# Patient Record
Sex: Female | Born: 1960
Health system: Southern US, Community
[De-identification: ages and names within clinical notes are randomized; demographics above are authoritative.]

## PROBLEM LIST (undated history)

## (undated) DIAGNOSIS — Z8041 Family history of malignant neoplasm of ovary: Secondary | ICD-10-CM

## (undated) DIAGNOSIS — L659 Nonscarring hair loss, unspecified: Secondary | ICD-10-CM

## (undated) DIAGNOSIS — Z800 Family history of malignant neoplasm of digestive organs: Secondary | ICD-10-CM

## (undated) DIAGNOSIS — D649 Anemia, unspecified: Secondary | ICD-10-CM

## (undated) DIAGNOSIS — E559 Vitamin D deficiency, unspecified: Secondary | ICD-10-CM

## (undated) DIAGNOSIS — H269 Unspecified cataract: Secondary | ICD-10-CM

## (undated) DIAGNOSIS — J45909 Unspecified asthma, uncomplicated: Secondary | ICD-10-CM

## (undated) DIAGNOSIS — R011 Cardiac murmur, unspecified: Secondary | ICD-10-CM

## (undated) DIAGNOSIS — Z9109 Other allergy status, other than to drugs and biological substances: Secondary | ICD-10-CM

## (undated) DIAGNOSIS — T7840XA Allergy, unspecified, initial encounter: Secondary | ICD-10-CM

## (undated) DIAGNOSIS — I10 Essential (primary) hypertension: Secondary | ICD-10-CM

## (undated) DIAGNOSIS — C439 Malignant melanoma of skin, unspecified: Secondary | ICD-10-CM

## (undated) DIAGNOSIS — K635 Polyp of colon: Secondary | ICD-10-CM

## (undated) HISTORY — DX: Nonscarring hair loss, unspecified: L65.9

## (undated) HISTORY — PX: CATARACT EXTRACTION: SUR2

## (undated) HISTORY — DX: Polyp of colon: K63.5

## (undated) HISTORY — DX: Family history of malignant neoplasm of ovary: Z80.41

## (undated) HISTORY — DX: Unspecified asthma, uncomplicated: J45.909

## (undated) HISTORY — DX: Allergy, unspecified, initial encounter: T78.40XA

## (undated) HISTORY — DX: Essential (primary) hypertension: I10

## (undated) HISTORY — DX: Unspecified cataract: H26.9

## (undated) HISTORY — DX: Vitamin D deficiency, unspecified: E55.9

## (undated) HISTORY — DX: Family history of malignant neoplasm of digestive organs: Z80.0

## (undated) HISTORY — DX: Other allergy status, other than to drugs and biological substances: Z91.09

## (undated) HISTORY — DX: Malignant melanoma of skin, unspecified: C43.9

## (undated) HISTORY — DX: Cardiac murmur, unspecified: R01.1

## (undated) HISTORY — DX: Anemia, unspecified: D64.9

## (undated) HISTORY — PX: ARM SKIN LESION BIOPSY / EXCISION: SUR471

---

## 2011-09-29 HISTORY — PX: CYSTECTOMY: SUR359

## 2012-12-27 LAB — HM MAMMOGRAPHY

## 2013-09-22 ENCOUNTER — Telehealth: Payer: Self-pay

## 2013-09-22 NOTE — Telephone Encounter (Signed)
Medication and allergies:  Reviewed and updated  90 day supply/mail order: na Local pharmacy: [facility] [address]   Immunizations due:  Will bring records to visit/didn't want to give over phone    Leaving to go out of town  A/P:   Entered FH, PSH and personal hx Recently moved to the area Daughter is your [phone]  To Discuss with Provider: Not at this time

## 2013-09-26 ENCOUNTER — Ambulatory Visit (INDEPENDENT_AMBULATORY_CARE_PROVIDER_SITE_OTHER): Payer: BC Managed Care – PPO | Admitting: Family Medicine

## 2013-09-26 ENCOUNTER — Encounter: Payer: Self-pay | Admitting: Family Medicine

## 2013-09-26 VITALS — BP 126/80 | HR 93 | Temp 97.6°F | Ht 66.0 in | Wt 150.4 lb

## 2013-09-26 DIAGNOSIS — Z0000 Encounter for general adult medical examination without abnormal findings: Secondary | ICD-10-CM

## 2013-09-26 DIAGNOSIS — I10 Essential (primary) hypertension: Secondary | ICD-10-CM

## 2013-09-26 HISTORY — DX: Essential (primary) hypertension: I10

## 2013-09-26 NOTE — Progress Notes (Signed)
Subjective:     Savannah Deleon is a 52 y.o. female and is here for a comprehensive physical exam. The patient reports no problems.  History   Social History  . Marital Status: Single    Spouse Name: N/A    Number of Children: N/A  . Years of Education: N/A   Occupational History  . retired    Social History Main Topics  . Smoking status: Never Smoker   . Smokeless tobacco: Never Used  . Alcohol Use: Yes     Comment: Seldom  . Drug Use: No  . Sexual Activity: Yes    Partners: Male   Other Topics Concern  . Not on file   Social History Narrative   Exercise--limited,  Just moved from [place]               Health Maintenance  Topic Date Due  . Colonoscopy  01/03/2009  . Influenza Vaccine  09/26/2014  . Pap Smear  11/06/2014  . Mammogram  12/28/2014  . Tetanus/tdap  09/26/2021    The following portions of the patient's history were reviewed and updated as appropriate:  She  has a past medical history of Hypertension; Cataract; Asthma; Environmental allergies; and Allergy. She  does not have a problem list on file. She  has past surgical history that includes Cataract extraction (Left) and Cystectomy (2013). Her family history includes Colon cancer in her father; Heart disease in her sister; Heart failure in her maternal grandfather; Hypertension in her mother; Ovarian cancer in her mother. She  reports that she has never smoked. She has never used smokeless tobacco. She reports that she drinks alcohol. She reports that she does not use illicit drugs. She has a current medication list which includes the following prescription(s): lisinopril-hydrochlorothiazide. No current outpatient prescriptions on file prior to visit.   No current facility-administered medications on file prior to visit.   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  Review of Systems Review of Systems  Constitutional: Negative for activity change, appetite change and fatigue.  HENT: Negative for hearing loss,  congestion, tinnitus and ear discharge.  dentist q1m Eyes: Negative for visual disturbance (see optho q1y )  Respiratory: Negative for cough, chest tightness and shortness of breath.   Cardiovascular: Negative for chest pain, palpitations and leg swelling.  Gastrointestinal: Negative for abdominal pain, diarrhea, constipation and abdominal distention.  Genitourinary: Negative for urgency, frequency, decreased urine volume and difficulty urinating.  Musculoskeletal: Negative for back pain, arthralgias and gait problem.  Skin: Negative for color change, pallor and rash.  Neurological: Negative for dizziness, light-headedness, numbness and headaches.  Hematological: Negative for adenopathy. Does not bruise/bleed easily.  Psychiatric/Behavioral: Negative for suicidal ideas, confusion, sleep disturbance, self-injury, dysphoric mood, decreased concentration and agitation.       Objective:    BP 126/80  Pulse 93  Temp(Src) 97.6 F (36.4 C) (Oral)  Ht 5\' 6"  (1.676 m)  Wt 150 lb 6.4 oz (68.221 kg)  BMI 24.29 kg/m2  SpO2 97%  LMP 06/28/2013 General appearance: alert, cooperative, appears stated age and no distress Head: Normocephalic, without obvious abnormality, atraumatic Eyes: negative findings: lids and lashes normal and pupils equal, round, reactive to light and accomodation Ears: normal TM's and external ear canals both ears Nose: Nares normal. Septum midline. Mucosa normal. No drainage or sinus tenderness. Throat: lips, mucosa, and tongue normal; teeth and gums normal Neck: no adenopathy, no carotid bruit, no JVD, supple, symmetrical, trachea midline and thyroid not enlarged, symmetric, no tenderness/mass/nodules Back: symmetric,  no curvature. ROM normal. No CVA tenderness. Lungs: clear to auscultation bilaterally Breasts: normal appearance, no masses or tenderness Heart: regular rate and rhythm, S1, S2 normal, no murmur, click, rub or gallop Abdomen: soft, non-tender; bowel sounds  normal; no masses,  no organomegaly Pelvic: deferred Extremities: extremities normal, atraumatic, no cyanosis or edema Pulses: 2+ and symmetric Skin: Skin color, texture, turgor normal. No rashes or lesions Lymph nodes: Cervical, supraclavicular, and axillary nodes normal. Neurologic: Alert and oriented X 3, normal strength and tone. Normal symmetric reflexes. Normal coordination and gait Psych-- no depression, no anxiety      Assessment:    Healthy female exam.      Plan:  ghm utd Check labs   See After Visit Summary for Counseling Recommendations

## 2013-09-26 NOTE — Progress Notes (Signed)
Pre visit review using our clinic review tool, if applicable. No additional management support is needed unless otherwise documented below in the visit note. 

## 2013-09-26 NOTE — Patient Instructions (Signed)
Preventive Care for Adults, Female A healthy lifestyle and preventive care can promote health and wellness. Preventive health guidelines for women include the following key practices.  A routine yearly physical is a good way to check with your caregiver about your health and preventive screening. It is a chance to share any concerns and updates on your health, and to receive a thorough exam.  Visit your dentist for a routine exam and preventive care every 6 months. Brush your teeth twice a day and floss once a day. Good oral hygiene prevents tooth decay and gum disease.  The frequency of eye exams is based on your age, health, family medical history, use of contact lenses, and other factors. Follow your caregiver's recommendations for frequency of eye exams.  Eat a healthy diet. Foods like vegetables, fruits, whole grains, low-fat dairy products, and lean protein foods contain the nutrients you need without too many calories. Decrease your intake of foods high in solid fats, added sugars, and salt. Eat the right amount of calories for you.Get information about a proper diet from your caregiver, if necessary.  Regular physical exercise is one of the most important things you can do for your health. Most adults should get at least 150 minutes of moderate-intensity exercise (any activity that increases your heart rate and causes you to sweat) each week. In addition, most adults need muscle-strengthening exercises on 2 or more days a week.  Maintain a healthy weight. The body mass index (BMI) is a screening tool to identify possible weight problems. It provides an estimate of body fat based on height and weight. Your caregiver can help determine your BMI, and can help you achieve or maintain a healthy weight.For adults 20 years and older:  A BMI below 18.5 is considered underweight.  A BMI of 18.5 to 24.9 is normal.  A BMI of 25 to 29.9 is considered overweight.  A BMI of 30 and above is  considered obese.  Maintain normal blood lipids and cholesterol levels by exercising and minimizing your intake of saturated fat. Eat a balanced diet with plenty of fruit and vegetables. Blood tests for lipids and cholesterol should begin at age 20 and be repeated every 5 years. If your lipid or cholesterol levels are high, you are over 50, or you are at high risk for heart disease, you may need your cholesterol levels checked more frequently.Ongoing high lipid and cholesterol levels should be treated with medicines if diet and exercise are not effective.  If you smoke, find out from your caregiver how to quit. If you do not use tobacco, do not start.  Lung cancer screening is recommended for adults aged 55 80 years who are at high risk for developing lung cancer because of a history of smoking. Yearly low-dose computed tomography (CT) is recommended for people who have at least a 30-pack-year history of smoking and are a current smoker or have quit within the past 15 years. A pack year of smoking is smoking an average of 1 pack of cigarettes a day for 1 year (for example: 1 pack a day for 30 years or 2 packs a day for 15 years). Yearly screening should continue until the smoker has stopped smoking for at least 15 years. Yearly screening should also be stopped for people who develop a health problem that would prevent them from having lung cancer treatment.  If you are pregnant, do not drink alcohol. If you are breastfeeding, be very cautious about drinking alcohol. If you are   not pregnant and choose to drink alcohol, do not exceed 1 drink per day. One drink is considered to be 12 ounces (355 mL) of beer, 5 ounces (148 mL) of wine, or 1.5 ounces (44 mL) of liquor.  Avoid use of street drugs. Do not share needles with anyone. Ask for help if you need support or instructions about stopping the use of drugs.  High blood pressure causes heart disease and increases the risk of stroke. Your blood pressure  should be checked at least every 1 to 2 years. Ongoing high blood pressure should be treated with medicines if weight loss and exercise are not effective.  If you are 55 to 54 years old, ask your caregiver if you should take aspirin to prevent strokes.  Diabetes screening involves taking a blood sample to check your fasting blood sugar level. This should be done once every 3 years, after age 45, if you are within normal weight and without risk factors for diabetes. Testing should be considered at a younger age or be carried out more frequently if you are overweight and have at least 1 risk factor for diabetes.  Breast cancer screening is essential preventive care for women. You should practice "breast self-awareness." This means understanding the normal appearance and feel of your breasts and may include breast self-examination. Any changes detected, no matter how small, should be reported to a caregiver. Women in their 20s and 30s should have a clinical breast exam (CBE) by a caregiver as part of a regular health exam every 1 to 3 years. After age 40, women should have a CBE every year. Starting at age 40, women should consider having a mammography (breast X-ray test) every year. Women who have a family history of breast cancer should talk to their caregiver about genetic screening. Women at a high risk of breast cancer should talk to their caregivers about having magnetic resonance imaging (MRI) and a mammography every year.  Breast cancer gene (BRCA)-related cancer risk assessment is recommended for women who have family members with BRCA-related cancers. BRCA-related cancers include breast, ovarian, tubal, and peritoneal cancers. Having family members with these cancers may be associated with an increased risk for harmful changes (mutations) in the breast cancer genes BRCA1 and BRCA2. Results of the assessment will determine the need for genetic counseling and BRCA1 and BRCA2 testing.  The Pap test is  a screening test for cervical cancer. A Pap test can show cell changes on the cervix that might become cervical cancer if left untreated. A Pap test is a procedure in which cells are obtained and examined from the lower end of the uterus (cervix).  Women should have a Pap test starting at age 21.  Between ages 21 and 29, Pap tests should be repeated every 2 years.  Beginning at age 30, you should have a Pap test every 3 years as long as the past 3 Pap tests have been normal.  Some women have medical problems that increase the chance of getting cervical cancer. Talk to your caregiver about these problems. It is especially important to talk to your caregiver if a new problem develops soon after your last Pap test. In these cases, your caregiver may recommend more frequent screening and Pap tests.  The above recommendations are the same for women who have or have not gotten the vaccine for human papillomavirus (HPV).  If you had a hysterectomy for a problem that was not cancer or a condition that could lead to cancer, then   you no longer need Pap tests. Even if you no longer need a Pap test, a regular exam is a good idea to make sure no other problems are starting.  If you are between ages 65 and 70, and you have had normal Pap tests going back 10 years, you no longer need Pap tests. Even if you no longer need a Pap test, a regular exam is a good idea to make sure no other problems are starting.  If you have had past treatment for cervical cancer or a condition that could lead to cancer, you need Pap tests and screening for cancer for at least 20 years after your treatment.  If Pap tests have been discontinued, risk factors (such as a new sexual partner) need to be reassessed to determine if screening should be resumed.  The HPV test is an additional test that may be used for cervical cancer screening. The HPV test looks for the virus that can cause the cell changes on the cervix. The cells collected  during the Pap test can be tested for HPV. The HPV test could be used to screen women aged 30 years and older, and should be used in women of any age who have unclear Pap test results. After the age of 30, women should have HPV testing at the same frequency as a Pap test.  Colorectal cancer can be detected and often prevented. Most routine colorectal cancer screening begins at the age of 50 and continues through age 75. However, your caregiver may recommend screening at an earlier age if you have risk factors for colon cancer. On a yearly basis, your caregiver may provide home test kits to check for hidden blood in the stool. Use of a small camera at the end of a tube, to directly examine the colon (sigmoidoscopy or colonoscopy), can detect the earliest forms of colorectal cancer. Talk to your caregiver about this at age 50, when routine screening begins. Direct examination of the colon should be repeated every 5 to 10 years through age 75, unless early forms of pre-cancerous polyps or small growths are found.  Hepatitis C blood testing is recommended for all people born from 1945 through 1965 and any individual with known risks for hepatitis C.  Practice safe sex. Use condoms and avoid high-risk sexual practices to reduce the spread of sexually transmitted infections (STIs). STIs include gonorrhea, chlamydia, syphilis, trichomonas, herpes, HPV, and human immunodeficiency virus (HIV). Herpes, HIV, and HPV are viral illnesses that have no cure. They can result in disability, cancer, and death. Sexually active women aged 25 and younger should be checked for chlamydia. Older women with new or multiple partners should also be tested for chlamydia. Testing for other STIs is recommended if you are sexually active and at increased risk.  Osteoporosis is a disease in which the bones lose minerals and strength with aging. This can result in serious bone fractures. The risk of osteoporosis can be identified using a  bone density scan. Women ages 65 and over and women at risk for fractures or osteoporosis should discuss screening with their caregivers. Ask your caregiver whether you should take a calcium supplement or vitamin D to reduce the rate of osteoporosis.  Menopause can be associated with physical symptoms and risks. Hormone replacement therapy is available to decrease symptoms and risks. You should talk to your caregiver about whether hormone replacement therapy is right for you.  Use sunscreen. Apply sunscreen liberally and repeatedly throughout the day. You should seek shade   when your shadow is shorter than you. Protect yourself by wearing long sleeves, pants, a wide-brimmed hat, and sunglasses year round, whenever you are outdoors.  Once a month, do a whole body skin exam, using a mirror to look at the skin on your back. Notify your caregiver of new moles, moles that have irregular borders, moles that are larger than a pencil eraser, or moles that have changed in shape or color.  Stay current with required immunizations.  Influenza vaccine. All adults should be immunized every year.  Tetanus, diphtheria, and acellular pertussis (Td, Tdap) vaccine. Pregnant women should receive 1 dose of Tdap vaccine during each pregnancy. The dose should be obtained regardless of the length of time since the last dose. Immunization is preferred during the 27th to 36th week of gestation. An adult who has not previously received Tdap or who does not know her vaccine status should receive 1 dose of Tdap. This initial dose should be followed by tetanus and diphtheria toxoids (Td) booster doses every 10 years. Adults with an unknown or incomplete history of completing a 3-dose immunization series with Td-containing vaccines should begin or complete a primary immunization series including a Tdap dose. Adults should receive a Td booster every 10 years.  Varicella vaccine. An adult without evidence of immunity to varicella  should receive 2 doses or a second dose if she has previously received 1 dose. Pregnant females who do not have evidence of immunity should receive the first dose after pregnancy. This first dose should be obtained before leaving the health care facility. The second dose should be obtained 4 8 weeks after the first dose.  Human papillomavirus (HPV) vaccine. Females aged 13 26 years who have not received the vaccine previously should obtain the 3-dose series. The vaccine is not recommended for use in pregnant females. However, pregnancy testing is not needed before receiving a dose. If a female is found to be pregnant after receiving a dose, no treatment is needed. In that case, the remaining doses should be delayed until after the pregnancy. Immunization is recommended for any person with an immunocompromised condition through the age of 26 years if she did not get any or all doses earlier. During the 3-dose series, the second dose should be obtained 4 8 weeks after the first dose. The third dose should be obtained 24 weeks after the first dose and 16 weeks after the second dose.  Zoster vaccine. One dose is recommended for adults aged 60 years or older unless certain conditions are present.  Measles, mumps, and rubella (MMR) vaccine. Adults born before 1957 generally are considered immune to measles and mumps. Adults born in 1957 or later should have 1 or more doses of MMR vaccine unless there is a contraindication to the vaccine or there is laboratory evidence of immunity to each of the three diseases. A routine second dose of MMR vaccine should be obtained at least 28 days after the first dose for students attending postsecondary schools, health care workers, or international travelers. People who received inactivated measles vaccine or an unknown type of measles vaccine during 1963 1967 should receive 2 doses of MMR vaccine. People who received inactivated mumps vaccine or an unknown type of mumps vaccine  before 1979 and are at high risk for mumps infection should consider immunization with 2 doses of MMR vaccine. For females of childbearing age, rubella immunity should be determined. If there is no evidence of immunity, females who are not pregnant should be vaccinated. If there   is no evidence of immunity, females who are pregnant should delay immunization until after pregnancy. Unvaccinated health care workers born before 1957 who lack laboratory evidence of measles, mumps, or rubella immunity or laboratory confirmation of disease should consider measles and mumps immunization with 2 doses of MMR vaccine or rubella immunization with 1 dose of MMR vaccine.  Pneumococcal 13-valent conjugate (PCV13) vaccine. When indicated, a person who is uncertain of her immunization history and has no record of immunization should receive the PCV13 vaccine. An adult aged 19 years or older who has certain medical conditions and has not been previously immunized should receive 1 dose of PCV13 vaccine. This PCV13 should be followed with a dose of pneumococcal polysaccharide (PPSV23) vaccine. The PPSV23 vaccine dose should be obtained at least 8 weeks after the dose of PCV13 vaccine. An adult aged 19 years or older who has certain medical conditions and previously received 1 or more doses of PPSV23 vaccine should receive 1 dose of PCV13. The PCV13 vaccine dose should be obtained 1 or more years after the last PPSV23 vaccine dose.  Pneumococcal polysaccharide (PPSV23) vaccine. When PCV13 is also indicated, PCV13 should be obtained first. All adults aged 65 years and older should be immunized. An adult younger than age 65 years who has certain medical conditions should be immunized. Any person who resides in a nursing home or long-term care facility should be immunized. An adult smoker should be immunized. People with an immunocompromised condition and certain other conditions should receive both PCV13 and PPSV23 vaccines. People  with human immunodeficiency virus (HIV) infection should be immunized as soon as possible after diagnosis. Immunization during chemotherapy or radiation therapy should be avoided. Routine use of PPSV23 vaccine is not recommended for American Indians, [place] Natives, or people younger than 65 years unless there are medical conditions that require PPSV23 vaccine. When indicated, people who have unknown immunization and have no record of immunization should receive PPSV23 vaccine. One-time revaccination 5 years after the first dose of PPSV23 is recommended for people aged 19 64 years who have chronic kidney failure, nephrotic syndrome, asplenia, or immunocompromised conditions. People who received 1 2 doses of PPSV23 before age 65 years should receive another dose of PPSV23 vaccine at age 65 years or later if at least 5 years have passed since the previous dose. Doses of PPSV23 are not needed for people immunized with PPSV23 at or after age 65 years.  Meningococcal vaccine. Adults with asplenia or persistent complement component deficiencies should receive 2 doses of quadrivalent meningococcal conjugate (MenACWY-D) vaccine. The doses should be obtained at least 2 months apart. Microbiologists working with certain meningococcal bacteria, military recruits, people at risk during an outbreak, and people who travel to or live in countries with a high rate of meningitis should be immunized. A first-year college student up through age 21 years who is living in a residence hall should receive a dose if she did not receive a dose on or after her 16th birthday. Adults who have certain high-risk conditions should receive one or more doses of vaccine.  Hepatitis A vaccine. Adults who wish to be protected from this disease, have certain high-risk conditions, work with hepatitis A-infected animals, work in hepatitis A research labs, or travel to or work in countries with a high rate of hepatitis A should be immunized. Adults  who were previously unvaccinated and who anticipate close contact with an international adoptee during the first 60 days after arrival in the [place] from a country   with a high rate of hepatitis A should be immunized.  Hepatitis B vaccine. Adults who wish to be protected from this disease, have certain high-risk conditions, may be exposed to blood or other infectious body fluids, are household contacts or sex partners of hepatitis B positive people, are clients or workers in certain care facilities, or travel to or work in countries with a high rate of hepatitis B should be immunized.  Haemophilus influenzae type b (Hib) vaccine. A previously unvaccinated person with asplenia or sickle cell disease or having a scheduled splenectomy should receive 1 dose of Hib vaccine. Regardless of previous immunization, a recipient of a hematopoietic stem cell transplant should receive a 3-dose series 6 12 months after her successful transplant. Hib vaccine is not recommended for adults with HIV infection. Preventive Services / Frequency Ages 19 to 39  Blood pressure check.** / Every 1 to 2 years.  Lipid and cholesterol check.** / Every 5 years beginning at age 20.  Clinical breast exam.** / Every 3 years for women in their 20s and 30s.  BRCA-related cancer risk assessment.** / For women who have family members with a BRCA-related cancer (breast, ovarian, tubal, or peritoneal cancers).  Pap test.** / Every 2 years from ages 21 through 29. Every 3 years starting at age 30 through age 65 or 70 with a history of 3 consecutive normal Pap tests.  HPV screening.** / Every 3 years from ages 30 through ages 65 to 70 with a history of 3 consecutive normal Pap tests.  Hepatitis C blood test.** / For any individual with known risks for hepatitis C.  Skin self-exam. / Monthly.  Influenza vaccine. / Every year.  Tetanus, diphtheria, and acellular pertussis (Tdap, Td) vaccine.** / Consult your caregiver. Pregnant  women should receive 1 dose of Tdap vaccine during each pregnancy. 1 dose of Td every 10 years.  Varicella vaccine.** / Consult your caregiver. Pregnant females who do not have evidence of immunity should receive the first dose after pregnancy.  HPV vaccine. / 3 doses over 6 months, if 26 and younger. The vaccine is not recommended for use in pregnant females. However, pregnancy testing is not needed before receiving a dose.  Measles, mumps, rubella (MMR) vaccine.** / You need at least 1 dose of MMR if you were born in 1957 or later. You may also need a 2nd dose. For females of childbearing age, rubella immunity should be determined. If there is no evidence of immunity, females who are not pregnant should be vaccinated. If there is no evidence of immunity, females who are pregnant should delay immunization until after pregnancy.  Pneumococcal 13-valent conjugate (PCV13) vaccine.** / Consult your caregiver.  Pneumococcal polysaccharide (PPSV23) vaccine.** / 1 to 2 doses if you smoke cigarettes or if you have certain conditions.  Meningococcal vaccine.** / 1 dose if you are age 19 to 21 years and a first-year college student living in a residence hall, or have one of several medical conditions, you need to get vaccinated against meningococcal disease. You may also need additional booster doses.  Hepatitis A vaccine.** / Consult your caregiver.  Hepatitis B vaccine.** / Consult your caregiver.  Haemophilus influenzae type b (Hib) vaccine.** / Consult your caregiver. Ages 40 to 64  Blood pressure check.** / Every 1 to 2 years.  Lipid and cholesterol check.** / Every 5 years beginning at age 20.  Lung cancer screening. / Every year if you are aged 55 80 years and have a 30-pack-year history of smoking and   currently smoke or have quit within the past 15 years. Yearly screening is stopped once you have quit smoking for at least 15 years or develop a health problem that would prevent you from having  lung cancer treatment.  Clinical breast exam.** / Every year after age 40.  BRCA-related cancer risk assessment.** / For women who have family members with a BRCA-related cancer (breast, ovarian, tubal, or peritoneal cancers).  Mammogram.** / Every year beginning at age 40 and continuing for as long as you are in good health. Consult with your caregiver.  Pap test.** / Every 3 years starting at age 30 through age 65 or 70 with a history of 3 consecutive normal Pap tests.  HPV screening.** / Every 3 years from ages 30 through ages 65 to 70 with a history of 3 consecutive normal Pap tests.  Fecal occult blood test (FOBT) of stool. / Every year beginning at age 50 and continuing until age 75. You may not need to do this test if you get a colonoscopy every 10 years.  Flexible sigmoidoscopy or colonoscopy.** / Every 5 years for a flexible sigmoidoscopy or every 10 years for a colonoscopy beginning at age 50 and continuing until age 75.  Hepatitis C blood test.** / For all people born from 1945 through 1965 and any individual with known risks for hepatitis C.  Skin self-exam. / Monthly.  Influenza vaccine. / Every year.  Tetanus, diphtheria, and acellular pertussis (Tdap/Td) vaccine.** / Consult your caregiver. Pregnant women should receive 1 dose of Tdap vaccine during each pregnancy. 1 dose of Td every 10 years.  Varicella vaccine.** / Consult your caregiver. Pregnant females who do not have evidence of immunity should receive the first dose after pregnancy.  Zoster vaccine.** / 1 dose for adults aged 60 years or older.  Measles, mumps, rubella (MMR) vaccine.** / You need at least 1 dose of MMR if you were born in 1957 or later. You may also need a 2nd dose. For females of childbearing age, rubella immunity should be determined. If there is no evidence of immunity, females who are not pregnant should be vaccinated. If there is no evidence of immunity, females who are pregnant should delay  immunization until after pregnancy.  Pneumococcal 13-valent conjugate (PCV13) vaccine.** / Consult your caregiver.  Pneumococcal polysaccharide (PPSV23) vaccine.** / 1 to 2 doses if you smoke cigarettes or if you have certain conditions.  Meningococcal vaccine.** / Consult your caregiver.  Hepatitis A vaccine.** / Consult your caregiver.  Hepatitis B vaccine.** / Consult your caregiver.  Haemophilus influenzae type b (Hib) vaccine.** / Consult your caregiver. Ages 65 and over  Blood pressure check.** / Every 1 to 2 years.  Lipid and cholesterol check.** / Every 5 years beginning at age 20.  Lung cancer screening. / Every year if you are aged 55 80 years and have a 30-pack-year history of smoking and currently smoke or have quit within the past 15 years. Yearly screening is stopped once you have quit smoking for at least 15 years or develop a health problem that would prevent you from having lung cancer treatment.  Clinical breast exam.** / Every year after age 40.  BRCA-related cancer risk assessment.** / For women who have family members with a BRCA-related cancer (breast, ovarian, tubal, or peritoneal cancers).  Mammogram.** / Every year beginning at age 40 and continuing for as long as you are in good health. Consult with your caregiver.  Pap test.** / Every 3 years starting at age   30 through age 65 or 70 with a 3 consecutive normal Pap tests. Testing can be stopped between 65 and 70 with 3 consecutive normal Pap tests and no abnormal Pap or HPV tests in the past 10 years.  HPV screening.** / Every 3 years from ages 30 through ages 65 or 70 with a history of 3 consecutive normal Pap tests. Testing can be stopped between 65 and 70 with 3 consecutive normal Pap tests and no abnormal Pap or HPV tests in the past 10 years.  Fecal occult blood test (FOBT) of stool. / Every year beginning at age 50 and continuing until age 75. You may not need to do this test if you get a colonoscopy  every 10 years.  Flexible sigmoidoscopy or colonoscopy.** / Every 5 years for a flexible sigmoidoscopy or every 10 years for a colonoscopy beginning at age 50 and continuing until age 75.  Hepatitis C blood test.** / For all people born from 1945 through 1965 and any individual with known risks for hepatitis C.  Osteoporosis screening.** / A one-time screening for women ages 65 and over and women at risk for fractures or osteoporosis.  Skin self-exam. / Monthly.  Influenza vaccine. / Every year.  Tetanus, diphtheria, and acellular pertussis (Tdap/Td) vaccine.** / 1 dose of Td every 10 years.  Varicella vaccine.** / Consult your caregiver.  Zoster vaccine.** / 1 dose for adults aged 60 years or older.  Pneumococcal 13-valent conjugate (PCV13) vaccine.** / Consult your caregiver.  Pneumococcal polysaccharide (PPSV23) vaccine.** / 1 dose for all adults aged 65 years and older.  Meningococcal vaccine.** / Consult your caregiver.  Hepatitis A vaccine.** / Consult your caregiver.  Hepatitis B vaccine.** / Consult your caregiver.  Haemophilus influenzae type b (Hib) vaccine.** / Consult your caregiver. ** Family history and personal history of risk and conditions may change your caregiver's recommendations. Document Released: 11/10/2001 Document Revised: 01/09/2013 Document Reviewed: 02/09/2011 ExitCare Patient Information 2014 [employer].  

## 2013-09-26 NOTE — Assessment & Plan Note (Signed)
con't med stable

## 2013-10-04 ENCOUNTER — Other Ambulatory Visit: Payer: Self-pay | Admitting: Family Medicine

## 2013-10-04 ENCOUNTER — Telehealth: Payer: Self-pay

## 2013-10-04 DIAGNOSIS — M79643 Pain in unspecified hand: Secondary | ICD-10-CM

## 2013-10-04 NOTE — Telephone Encounter (Signed)
Spoke with patient who was was checking the status of her Ortho referral. She said she discussed with Dr.Lowne at her OV the nodules on her hands (Knuckles and middle finger) and a referral for Ortho was going to be put in. No documentation. Please advise      KP

## 2013-10-04 NOTE — Telephone Encounter (Signed)
Referral in

## 2013-11-03 ENCOUNTER — Encounter: Payer: Self-pay | Admitting: Family Medicine

## 2013-11-15 ENCOUNTER — Telehealth: Payer: Self-pay

## 2013-11-15 MED ORDER — LISINOPRIL-HYDROCHLOROTHIAZIDE 10-12.5 MG PO TABS: 1.0000 | ORAL_TABLET | Freq: Every day | ORAL | Status: DC

## 2013-11-15 NOTE — Telephone Encounter (Signed)
The patient called and is hoping to get a refill of her lisinopril sent to [facility] on [address] (pharmacy phone [phone])   The pt states while she was at her apt, she was told the medication was called into this pharmacy.   Pt callback - [phone] / cell [phone]

## 2013-11-15 NOTE — Telephone Encounter (Signed)
Med filled.  

## 2014-03-06 ENCOUNTER — Other Ambulatory Visit: Payer: Self-pay | Admitting: Family Medicine

## 2014-03-06 DIAGNOSIS — Z1231 Encounter for screening mammogram for malignant neoplasm of breast: Secondary | ICD-10-CM

## 2014-03-08 ENCOUNTER — Encounter: Payer: Self-pay | Admitting: Internal Medicine

## 2014-03-21 ENCOUNTER — Ambulatory Visit: Payer: BC Managed Care – PPO | Admitting: Family Medicine

## 2014-04-25 ENCOUNTER — Ambulatory Visit (AMBULATORY_SURGERY_CENTER): Payer: Federal, State, Local not specified - PPO | Admitting: *Deleted

## 2014-04-25 VITALS — Ht 66.0 in | Wt 158.4 lb

## 2014-04-25 DIAGNOSIS — Z800 Family history of malignant neoplasm of digestive organs: Secondary | ICD-10-CM

## 2014-04-25 DIAGNOSIS — Z1211 Encounter for screening for malignant neoplasm of colon: Secondary | ICD-10-CM

## 2014-04-25 MED ORDER — MOVIPREP 100 G PO SOLR: 1.0000 | Freq: Once | ORAL | Status: DC

## 2014-04-25 NOTE — Progress Notes (Signed)
Denies allergies to eggs or soy products. Denies complications with sedation or anesthesia. Denies O2 use. Denies use of diet or weight loss medications.  Emmi instructions given for colonoscopy.  

## 2014-04-26 ENCOUNTER — Encounter (INDEPENDENT_AMBULATORY_CARE_PROVIDER_SITE_OTHER): Payer: Self-pay

## 2014-04-26 ENCOUNTER — Ambulatory Visit
Admission: RE | Admit: 2014-04-26 | Discharge: 2014-04-26 | Disposition: A | Discharge status: Home / Self Care | Payer: Federal, State, Local not specified - PPO | Source: Ambulatory Visit | Attending: Family Medicine | Admitting: Family Medicine

## 2014-04-26 DIAGNOSIS — Z1231 Encounter for screening mammogram for malignant neoplasm of breast: Secondary | ICD-10-CM

## 2014-05-07 ENCOUNTER — Telehealth: Payer: Self-pay | Admitting: Internal Medicine

## 2014-05-08 ENCOUNTER — Ambulatory Visit: Payer: BC Managed Care – PPO | Admitting: Family Medicine

## 2014-05-08 NOTE — Telephone Encounter (Signed)
No charge. 

## 2014-05-09 ENCOUNTER — Encounter: Payer: BC Managed Care – PPO | Admitting: Internal Medicine

## 2014-05-14 ENCOUNTER — Telehealth: Payer: Self-pay

## 2014-05-14 ENCOUNTER — Ambulatory Visit (INDEPENDENT_AMBULATORY_CARE_PROVIDER_SITE_OTHER): Payer: Federal, State, Local not specified - PPO | Admitting: Family Medicine

## 2014-05-14 ENCOUNTER — Encounter: Payer: Self-pay | Admitting: Family Medicine

## 2014-05-14 VITALS — BP 124/72 | HR 74 | Temp 97.7°F | Ht 66.0 in | Wt 158.0 lb

## 2014-05-14 DIAGNOSIS — Z0000 Encounter for general adult medical examination without abnormal findings: Secondary | ICD-10-CM

## 2014-05-14 DIAGNOSIS — N816 Rectocele: Secondary | ICD-10-CM

## 2014-05-14 DIAGNOSIS — Z01419 Encounter for gynecological examination (general) (routine) without abnormal findings: Secondary | ICD-10-CM

## 2014-05-14 LAB — LIPID PANEL
Cholesterol: 261 mg/dL — ABNORMAL HIGH (ref 0–200)
HDL: 75.4 mg/dL (ref >39.00)
LDL Cholesterol: 162 mg/dL — ABNORMAL HIGH (ref 0–99)
NONHDL: 185.6
TRIGLYCERIDES: 119 mg/dL (ref 0.0–149.0)
Total CHOL/HDL Ratio: 3
VLDL: 23.8 mg/dL (ref 0.0–40.0)

## 2014-05-14 LAB — POCT URINALYSIS DIPSTICK
Bilirubin, UA: NEGATIVE
GLUCOSE UA: NEGATIVE
Ketones, UA: NEGATIVE
Leukocytes, UA: NEGATIVE
Nitrite, UA: NEGATIVE
PH UA: 7
Protein, UA: NEGATIVE
RBC UA: NEGATIVE
UROBILINOGEN UA: 0.2

## 2014-05-14 LAB — BASIC METABOLIC PANEL
BUN: 13 mg/dL (ref 6–23)
CO2: 28 meq/L (ref 19–32)
CREATININE: 0.8 mg/dL (ref 0.4–1.2)
Calcium: 9.4 mg/dL (ref 8.4–10.5)
Chloride: 97 mEq/L (ref 96–112)
GFR: 91.67 mL/min (ref >60.00)
Glucose, Bld: 64 mg/dL — ABNORMAL LOW (ref 70–99)
Potassium: 3.5 mEq/L (ref 3.5–5.1)
Sodium: 135 mEq/L (ref 135–145)

## 2014-05-14 LAB — HEPATIC FUNCTION PANEL
ALK PHOS: 52 U/L (ref 39–117)
ALT: 19 U/L (ref 0–35)
AST: 23 U/L (ref 0–37)
Albumin: 4 g/dL (ref 3.5–5.2)
BILIRUBIN DIRECT: 0 mg/dL (ref 0.0–0.3)
TOTAL PROTEIN: 7.8 g/dL (ref 6.0–8.3)
Total Bilirubin: 0.7 mg/dL (ref 0.2–1.2)

## 2014-05-14 LAB — CBC WITH DIFFERENTIAL/PLATELET
BASOS ABS: 0 10*3/uL (ref 0.0–0.1)
BASOS PCT: 0.3 % (ref 0.0–3.0)
EOS ABS: 0.1 10*3/uL (ref 0.0–0.7)
Eosinophils Relative: 1 % (ref 0.0–5.0)
HCT: 41.1 % (ref 36.0–46.0)
Hemoglobin: 13.4 g/dL (ref 12.0–15.0)
Lymphocytes Relative: 32.3 % (ref 12.0–46.0)
Lymphs Abs: 2 10*3/uL (ref 0.7–4.0)
MCHC: 32.6 g/dL (ref 30.0–36.0)
MCV: 84.8 fl (ref 78.0–100.0)
MONO ABS: 0.8 10*3/uL (ref 0.1–1.0)
Monocytes Relative: 13.1 % — ABNORMAL HIGH (ref 3.0–12.0)
NEUTROS PCT: 53.3 % (ref 43.0–77.0)
Neutro Abs: 3.3 10*3/uL (ref 1.4–7.7)
Platelets: 312 10*3/uL (ref 150.0–400.0)
RBC: 4.85 Mil/uL (ref 3.87–5.11)
RDW: 15 % (ref 11.5–15.5)
WBC: 6.3 10*3/uL (ref 4.0–10.5)

## 2014-05-14 LAB — TSH: TSH: 0.64 u[IU]/mL (ref 0.35–4.50)

## 2014-05-14 MED ORDER — LISINOPRIL-HYDROCHLOROTHIAZIDE 10-12.5 MG PO TABS: 1.0000 | ORAL_TABLET | Freq: Every day | ORAL | Status: DC

## 2014-05-14 NOTE — Patient Instructions (Signed)
Pap Test A Pap test is a procedure done in a clinic office to evaluate cells that are on the surface of the cervix. The cervix is the lower portion of the uterus and upper portion of the vagina. For some women, the cervical region has the potential to form cancer. With consistent evaluations by your caregiver, this type of cancer can be prevented.  If a Pap test is abnormal, it is most often a result of a previous exposure to human papillomavirus (HPV). HPV is a virus that can infect the cells of the cervix and cause dysplasia. Dysplasia is where the cells no longer look normal. If a woman has been diagnosed with high-grade or severe dysplasia, they are at higher risk of developing cervical cancer. People diagnosed with low-grade dysplasia should still be seen by their caregiver because there is a small chance that low-grade dysplasia could develop into cancer.  LET YOUR CAREGIVER KNOW ABOUT:  Recent sexually transmitted infection (STI) you have had.  Any new sex partners you have had.  History of previous abnormal Pap tests results.  History of previous cervical procedures you have had (colposcopy, biopsy, loop electrosurgical excision procedure [LEEP]).  Concerns you have had regarding unusual vaginal discharge.  History of pelvic pain.  Your use of birth control. BEFORE THE PROCEDURE  Ask your caregiver when to schedule your Pap test. It is best not to be on your period if your caregiver uses a wooden spatula to collect cells or applies cells to a glass slide. Newer techniques are not so sensitive to the timing of a menstrual cycle.  Do not douche or have sexual intercourse for 24 hours before the test.   Do not use vaginal creams or tampons for 24 hours before the test.   Empty your bladder just before the test to lessen any discomfort.  PROCEDURE You will lie on an exam table with your feet in stirrups. A warm metal or plastic instrument (speculum) is placed in your vagina. This  instrument allows your caregiver to see the inside of your vagina and look at your cervix. A small, plastic brush or wooden spatula is then used to collect cervical cells. These cells are placed in a lab specimen container. The cells are looked at under a microscope. A specialist will determine if the cells are normal.  AFTER THE PROCEDURE Make sure to get your test results.If your results come back abnormal, you may need further testing.  Document Released: 12/05/2002 Document Revised: 12/07/2011 Document Reviewed: 09/10/2011 ExitCare Patient Information 2015 [employer]. This information is not intended to replace advice given to you by your health care provider. Make sure you discuss any questions you have with your health care provider.  

## 2014-05-14 NOTE — Progress Notes (Signed)
Pre visit review using our clinic review tool, if applicable. No additional management support is needed unless otherwise documented below in the visit note. 

## 2014-05-14 NOTE — Telephone Encounter (Signed)
Spoke with pt and she is aware.

## 2014-05-14 NOTE — Telephone Encounter (Signed)
Message copied by Algernon Huxley on Mon May 14, 2014  1:31 PM ------      Message from: Jerene Bears      Created: Mon May 14, 2014 12:16 PM       Should be okay for colonoscopy, but thanks for letting me know      Ether Griffins      Please let pt know she should be fine with colonoscopy given her rectocele.      Thanks      JMP            ----- Message -----         From: Rosalita Chessman, DO         Sent: 05/14/2014  10:45 AM           To: Jerene Bears, MD            Kaisa is scheduled for a colonoscopy with you on Wednesday.  She has a pretty significant rectocele.  When she was called for prescreening she said she mentioned it to them but whoever she spoke with did not know if it would be a problem.  I'm sending her to gyn but of course the patient wants to know if you will be able to do the colonoscopy before she does the prep.   Thanks   Kendrick Fries       ------

## 2014-05-14 NOTE — Progress Notes (Signed)
Subjective:     Savannah Deleon is a 53 y.o. female and is here for a comprehensive physical exam. The patient reports no problems.  History   Social History  . Marital Status: Single    Spouse Name: N/A    Number of Children: N/A  . Years of Education: N/A   Occupational History  . retired    Social History Main Topics  . Smoking status: Never Smoker   . Smokeless tobacco: Never Used  . Alcohol Use: Yes     Comment: Seldom  . Drug Use: No  . Sexual Activity: Yes    Partners: Male   Other Topics Concern  . Not on file   Social History Narrative   Exercise--limited,  Just moved from [place]               Health Maintenance  Topic Date Due  . Colonoscopy  01/03/2009  . Influenza Vaccine  07/14/2014 (Originally 04/28/2014)  . Pap Smear  11/06/2014  . Mammogram  04/26/2016  . Tetanus/tdap  09/26/2021    The following portions of the patient's history were reviewed and updated as appropriate:  She  has a past medical history of Hypertension; Cataract; Asthma; Environmental allergies; and Allergy. She  does not have any pertinent problems on file. She  has past surgical history that includes Cataract extraction (Left) and Cystectomy (2013). Her family history includes Colon cancer in her father; Heart disease in her sister; Heart failure in her maternal grandfather; Hypertension in her mother; Ovarian cancer in her mother. She  reports that she has never smoked. She has never used smokeless tobacco. She reports that she drinks alcohol. She reports that she does not use illicit drugs. She has a current medication list which includes the following prescription(s): lisinopril-hydrochlorothiazide, moviprep, multiple vitamins-minerals, fish oil, and probiotic product. Current Outpatient Prescriptions on File Prior to Visit  Medication Sig Dispense Refill  . MOVIPREP 100 G SOLR Take 1 kit (200 g total) by mouth once.  1 kit  0  . Multiple Vitamins-Minerals (MULTIVITAMIN PO) Take by  mouth.      . Omega-3 Fatty Acids (FISH OIL) 1200 MG CAPS Take by mouth.      . Probiotic Product (PROBIOTIC DAILY PO) Take by mouth.       No current facility-administered medications on file prior to visit.   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  Review of Systems Review of Systems  Constitutional: Negative for activity change, appetite change and fatigue.  HENT: Negative for hearing loss, congestion, tinnitus and ear discharge.  dentist q37mEyes: Negative for visual disturbance (see optho q1y -- vision corrected to 20/20 with glasses).  Respiratory: Negative for cough, chest tightness and shortness of breath.   Cardiovascular: Negative for chest pain, palpitations and leg swelling.  Gastrointestinal: Negative for abdominal pain, diarrhea, constipation and abdominal distention.  Genitourinary: Negative for urgency, frequency, decreased urine volume and difficulty urinating.  Musculoskeletal: Negative for back pain, arthralgias and gait problem.  Skin: Negative for color change, pallor and rash.  Neurological: Negative for dizziness, light-headedness, numbness and headaches.  Hematological: Negative for adenopathy. Does not bruise/bleed easily.  Psychiatric/Behavioral: Negative for suicidal ideas, confusion, sleep disturbance, self-injury, dysphoric mood, decreased concentration and agitation.       Objective:     BP 124/72  Pulse 74  Temp(Src) 97.7 F (36.5 C) (Oral)  Ht 5' 6"  (1.676 m)  Wt 158 lb (71.668 kg)  BMI 25.51 kg/m2  SpO2 98%  LMP 06/28/2013  General Appearance:    Alert, cooperative, no distress, appears stated age  Head:    Normocephalic, without obvious abnormality, atraumatic  Eyes:    PERRL, conjunctiva/corneas clear, EOM's intact, fundi    benign, both eyes  Ears:    Normal TM's and external ear canals, both ears  Nose:   Nares normal, septum midline, mucosa normal, no drainage    or sinus tenderness  Throat:   Lips, mucosa, and tongue normal; teeth and gums  normal  Neck:   Supple, symmetrical, trachea midline, no adenopathy;    thyroid:  no enlargement/tenderness/nodules; no carotid   bruit or JVD  Back:     Symmetric, no curvature, ROM normal, no CVA tenderness  Lungs:     Clear to auscultation bilaterally, respirations unlabored  Chest Wall:    No tenderness or deformity   Heart:    Regular rate and rhythm, S1 and S2 normal, no murmur, rub   or gallop  Breast Exam:    No tenderness, masses, or nipple abnormality  Abdomen:     Soft, non-tender, bowel sounds active all four quadrants,    no masses, no organomegaly  Genitalia:    Normal female without lesion, discharge or tenderness-- pap done  Rectal:    Normal tone,, no masses or tenderness;   guaiac negative stool  Extremities:   Extremities normal, atraumatic, no cyanosis or edema  Pulses:   2+ and symmetric all extremities  Skin:   Skin color, texture, turgor normal, no rashes or lesions  Lymph nodes:   Cervical, supraclavicular, and axillary nodes normal  Neurologic:   CNII-XII intact, normal strength, sensation and reflexes    throughout     Assessment:    Healthy female exam.      Plan:    ghm utd--- colonoscopy Wed--- d/w GI-- ok for colon with rectocele Check labs See After Visit Summary for Counseling Recommendations   1. Preventative health care   - Basic metabolic panel - CBC with Differential - Hepatic function panel - Lipid panel - POCT urinalysis dipstick - TSH  2. Rectocele   - Ambulatory referral to Gynecology  3. Encounter for routine gynecological examination

## 2014-05-16 ENCOUNTER — Ambulatory Visit (AMBULATORY_SURGERY_CENTER): Payer: Federal, State, Local not specified - PPO | Admitting: Internal Medicine

## 2014-05-16 ENCOUNTER — Encounter: Payer: Self-pay | Admitting: Internal Medicine

## 2014-05-16 VITALS — BP 113/70 | HR 79 | Temp 98.3°F | Resp 16 | Ht 66.0 in | Wt 155.0 lb

## 2014-05-16 DIAGNOSIS — Z1211 Encounter for screening for malignant neoplasm of colon: Secondary | ICD-10-CM

## 2014-05-16 DIAGNOSIS — D126 Benign neoplasm of colon, unspecified: Secondary | ICD-10-CM

## 2014-05-16 MED ORDER — SODIUM CHLORIDE 0.9 % IV SOLN: 500.0000 mL | INTRAVENOUS | Status: DC

## 2014-05-16 NOTE — Progress Notes (Signed)
Procedure ends, to recovery, report given and VSS. 

## 2014-05-16 NOTE — Progress Notes (Signed)
Called to room to assist during endoscopic procedure.  Patient ID and intended procedure confirmed with present staff. Received instructions for my participation in the procedure from the performing physician.  

## 2014-05-16 NOTE — Op Note (Signed)
[facility]  [address] [place], [address]   COLONOSCOPY PROCEDURE REPORT  PATIENT: Savannah Deleon, Limb  MR#: [id number] BIRTHDATE: Mar 08, 1961 , 47  yrs. old GENDER: Female ENDOSCOPIST: Jerene Bears, MD REFERRED MM:NOTRRN Latimer, DO PROCEDURE DATE:  05/16/2014 PROCEDURE:   Colonoscopy with snare polypectomy First Screening Colonoscopy - Avg.  risk and is 50 yrs.  old or older - No.  Prior Negative Screening - Now for repeat screening. N/A  History of Adenoma - Now for follow-up colonoscopy & has been > or = to 3 yrs.  N/A  Polyps Removed Today? Yes. ASA CLASS:   Class II INDICATIONS:elevated risk screening, Patient's immediate family history of colon cancer, and first colonoscopy. MEDICATIONS: MAC sedation, administered by CRNA and propofol (Diprivan) 260mg  IV  DESCRIPTION OF PROCEDURE:   After the risks benefits and alternatives of the procedure were thoroughly explained, informed consent was obtained.  A digital rectal exam revealed no rectal mass and vaginal prolapse.   The LB [id number]  endoscope was introduced through the anus and advanced to the cecum, which was identified by both the appendix and ileocecal valve. No adverse events experienced.   The quality of the prep was good, using MoviPrep  The instrument was then slowly withdrawn as the colon was fully examined.   COLON FINDINGS: A sessile polyp measuring 5 mm in size was found in the transverse colon.  A polypectomy was performed with a cold snare.  The resection was complete and the polyp tissue was completely retrieved.   The colon mucosa was otherwise normal. Retroflexed views revealed no abnormalities. The time to cecum=3 minutes 49 seconds.  Withdrawal time=9 minutes 05 seconds.  The scope was withdrawn and the procedure completed. COMPLICATIONS: There were no complications.  ENDOSCOPIC IMPRESSION: 1.   Sessile polyp measuring 5 mm in size was found in the transverse colon; polypectomy  was performed with a cold snare 2.   The colon mucosa was otherwise normal  RECOMMENDATIONS: 1.  Await pathology results 2.  Repeat Colonoscopy in 5 years. 3.  You will receive a letter within 1-2 weeks with the results of your biopsy as well as final recommendations.  Please call my office if you have not received a letter after 3 weeks.   eSigned:  Jerene Bears, MD 05/16/2014 2:34 PM   cc: The Patient and Rosalita Chessman, DO

## 2014-05-16 NOTE — Patient Instructions (Signed)
Colon polyp removed today, see handout given. Repeat colonoscopy in 5 years. Resume current medications.  Call us with any questions or concerns. Thank you!!  YOU HAD AN ENDOSCOPIC PROCEDURE TODAY AT [facility]: Refer to the procedure report that was given to you for any specific questions about what was found during the examination.  If the procedure report does not answer your questions, please call your gastroenterologist to clarify.  If you requested that your care partner not be given the details of your procedure findings, then the procedure report has been included in a sealed envelope for you to review at your convenience later.  YOU SHOULD EXPECT: Some feelings of bloating in the abdomen. Passage of more gas than usual.  Walking can help get rid of the air that was put into your GI tract during the procedure and reduce the bloating. If you had a lower endoscopy (such as a colonoscopy or flexible sigmoidoscopy) you may notice spotting of blood in your stool or on the toilet paper. If you underwent a bowel prep for your procedure, then you may not have a normal bowel movement for a few days.  DIET: Your first meal following the procedure should be a light meal and then it is ok to progress to your normal diet.  A half-sandwich or bowl of soup is an example of a good first meal.  Heavy or fried foods are harder to digest and may make you feel nauseous or bloated.  Likewise meals heavy in dairy and vegetables can cause extra gas to form and this can also increase the bloating.  Drink plenty of fluids but you should avoid alcoholic beverages for 24 hours.  ACTIVITY: Your care partner should take you home directly after the procedure.  You should plan to take it easy, moving slowly for the rest of the day.  You can resume normal activity the day after the procedure however you should NOT DRIVE or use heavy machinery for 24 hours (because of the sedation medicines used during the test).     SYMPTOMS TO REPORT IMMEDIATELY: A gastroenterologist can be reached at any hour.  During normal business hours, 8:30 AM to 5:00 PM Monday through Friday, call [phone].  After hours and on weekends, please call the GI answering service at [phone] who will take a message and have the physician on call contact you.   Following lower endoscopy (colonoscopy or flexible sigmoidoscopy):  Excessive amounts of blood in the stool  Significant tenderness or worsening of abdominal pains  Swelling of the abdomen that is new, acute  Fever of 100F or higher  Following upper endoscopy (EGD)  Vomiting of blood or coffee ground material  New chest pain or pain under the shoulder blades  Painful or persistently difficult swallowing  New shortness of breath  Fever of 100F or higher  Black, tarry-looking stools  FOLLOW UP: If any biopsies were taken you will be contacted by phone or by letter within the next 1-3 weeks.  Call your gastroenterologist if you have not heard about the biopsies in 3 weeks.  Our staff will call the home number listed on your records the next business day following your procedure to check on you and address any questions or concerns that you may have at that time regarding the information given to you following your procedure. This is a courtesy call and so if there is no answer at the home number and we have not heard from you  through the emergency physician on call, we will assume that you have returned to your regular daily activities without incident.  SIGNATURES/CONFIDENTIALITY: You and/or your care partner have signed paperwork which will be entered into your electronic medical record.  These signatures attest to the fact that that the information above on your After Visit Summary has been reviewed and is understood.  Full responsibility of the confidentiality of this discharge information lies with you and/or your care-partner.

## 2014-05-17 ENCOUNTER — Telehealth: Payer: Self-pay | Admitting: *Deleted

## 2014-05-17 NOTE — Telephone Encounter (Signed)
  Follow up Call-  Call back number 05/16/2014  Post procedure Call Back phone  # [phone] or cell [phone]  Permission to leave phone message Yes     Patient questions:  Do you have a fever, pain , or abdominal swelling? No. Pain Score  0 *  Have you tolerated food without any problems? Yes.    Have you been able to return to your normal activities? Yes.    Do you have any questions about your discharge instructions: Diet   No. Medications  No. Follow up visit  No.  Do you have questions or concerns about your Care? No.  Actions: * If pain score is 4 or above: No action needed, pain <4.

## 2014-05-22 ENCOUNTER — Encounter: Payer: Self-pay | Admitting: Internal Medicine

## 2014-06-07 ENCOUNTER — Other Ambulatory Visit: Payer: Self-pay | Admitting: Family Medicine

## 2015-02-11 ENCOUNTER — Telehealth: Payer: Self-pay | Admitting: Family Medicine

## 2015-02-11 MED ORDER — LISINOPRIL-HYDROCHLOROTHIAZIDE 10-12.5 MG PO TABS: 1.0000 | ORAL_TABLET | Freq: Every day | ORAL | Status: DC

## 2015-02-11 NOTE — Telephone Encounter (Signed)
The patient is due for an apt. Please schedule AEX in august.    KP

## 2015-02-11 NOTE — Telephone Encounter (Signed)
Caller name:Averlee Dacquisto Relationship to patient:self Can be reached:[phone] Pharmacy: [facility] [address] [place]   Reason for call: Pt requesting refill of lisinopril-hydrochlorothiazide (PRINZIDE,ZESTORETIC) 10-12.5 MG per tablet [[id number]]- requesting 90 day supply

## 2015-02-12 NOTE — Telephone Encounter (Signed)
Lm on vm to call office to sched appt.

## 2015-04-09 ENCOUNTER — Other Ambulatory Visit: Payer: Self-pay

## 2015-04-09 ENCOUNTER — Telehealth: Payer: Self-pay | Admitting: Family Medicine

## 2015-04-09 DIAGNOSIS — Z1231 Encounter for screening mammogram for malignant neoplasm of breast: Secondary | ICD-10-CM

## 2015-04-09 MED ORDER — LISINOPRIL-HYDROCHLOROTHIAZIDE 10-12.5 MG PO TABS: 1.0000 | ORAL_TABLET | Freq: Every day | ORAL | Status: DC

## 2015-04-09 NOTE — Telephone Encounter (Signed)
Rx sent 

## 2015-04-09 NOTE — Telephone Encounter (Signed)
Relation to pt: self  Call back number:[phone] Pharmacy: [facility] [id number] - [place], [place] [address] [phone] (Phone) [phone] (Fax)         Reason for call:  Pt requesting a refill lisinopril-hydrochlorothiazide (PRINZIDE,ZESTORETIC) 10-12.5 MG per tablet to hold her over until physical appointment 05/20/2015.

## 2015-05-10 ENCOUNTER — Ambulatory Visit
Admission: RE | Admit: 2015-05-10 | Discharge: 2015-05-10 | Disposition: A | Discharge status: Home / Self Care | Payer: Federal, State, Local not specified - PPO | Source: Ambulatory Visit

## 2015-05-10 DIAGNOSIS — Z1231 Encounter for screening mammogram for malignant neoplasm of breast: Secondary | ICD-10-CM

## 2015-05-14 ENCOUNTER — Other Ambulatory Visit: Payer: Self-pay | Admitting: Family Medicine

## 2015-05-14 DIAGNOSIS — R928 Other abnormal and inconclusive findings on diagnostic imaging of breast: Secondary | ICD-10-CM

## 2015-05-15 MED ORDER — LISINOPRIL-HYDROCHLOROTHIAZIDE 10-12.5 MG PO TABS: 1.0000 | ORAL_TABLET | Freq: Every day | ORAL | Status: DC

## 2015-05-15 NOTE — Telephone Encounter (Signed)
Rx faxed again.    KP

## 2015-05-15 NOTE — Telephone Encounter (Addendum)
Patient called inquiring about medication request. Advised patient Rx was sent over 04/09/15 90 tablets patient states pharmacy never received. Patient states she she will run out by 05/20/15 physical appointment

## 2015-05-15 NOTE — Addendum Note (Signed)
Addended by: Ewing Schlein on: 05/15/2015 09:58 AM   Modules accepted: Orders

## 2015-05-17 ENCOUNTER — Ambulatory Visit
Admission: RE | Admit: 2015-05-17 | Discharge: 2015-05-17 | Disposition: A | Discharge status: Home / Self Care | Payer: Federal, State, Local not specified - PPO | Source: Ambulatory Visit | Attending: Family Medicine | Admitting: Family Medicine

## 2015-05-17 ENCOUNTER — Telehealth: Payer: Self-pay | Admitting: Family Medicine

## 2015-05-17 ENCOUNTER — Telehealth: Payer: Self-pay

## 2015-05-17 DIAGNOSIS — R928 Other abnormal and inconclusive findings on diagnostic imaging of breast: Secondary | ICD-10-CM

## 2015-05-17 NOTE — Telephone Encounter (Signed)
LMOVM

## 2015-05-17 NOTE — Telephone Encounter (Signed)
Patient returning your call best [phone]

## 2015-05-17 NOTE — Telephone Encounter (Signed)
pre visit letter mailed 04/29/15

## 2015-05-20 ENCOUNTER — Encounter: Payer: Self-pay | Admitting: Family Medicine

## 2015-05-20 ENCOUNTER — Ambulatory Visit (INDEPENDENT_AMBULATORY_CARE_PROVIDER_SITE_OTHER): Payer: Federal, State, Local not specified - PPO | Admitting: Family Medicine

## 2015-05-20 VITALS — BP 118/88 | HR 86 | Temp 98.8°F | Ht 66.0 in | Wt 154.8 lb

## 2015-05-20 DIAGNOSIS — Z0000 Encounter for general adult medical examination without abnormal findings: Secondary | ICD-10-CM

## 2015-05-20 DIAGNOSIS — I10 Essential (primary) hypertension: Secondary | ICD-10-CM | POA: Diagnosis not present

## 2015-05-20 DIAGNOSIS — H547 Unspecified visual loss: Secondary | ICD-10-CM

## 2015-05-20 LAB — POCT URINALYSIS DIPSTICK
Bilirubin, UA: NEGATIVE
GLUCOSE UA: NEGATIVE
Ketones, UA: NEGATIVE
Leukocytes, UA: NEGATIVE
Nitrite, UA: NEGATIVE
PROTEIN UA: NEGATIVE
RBC UA: NEGATIVE
Spec Grav, UA: <=1.005
UROBILINOGEN UA: 2
pH, UA: 6

## 2015-05-20 MED ORDER — LISINOPRIL-HYDROCHLOROTHIAZIDE 10-12.5 MG PO TABS: 1.0000 | ORAL_TABLET | Freq: Every day | ORAL | Status: DC

## 2015-05-20 NOTE — Progress Notes (Signed)
Subjective:     Savannah Deleon is a 54 y.o. female and is here for a comprehensive physical exam. The patient reports no problems.  Social History   Social History  . Marital Status: Single    Spouse Name: N/A  . Number of Children: N/A  . Years of Education: N/A   Occupational History  . retired    Social History Main Topics  . Smoking status: Never Smoker   . Smokeless tobacco: Never Used  . Alcohol Use: Yes     Comment: Seldom  . Drug Use: No  . Sexual Activity:    Partners: Male   Other Topics Concern  . Not on file   Social History Narrative   Exercise--limited,  Just moved from [place]               Health Maintenance  Topic Date Due  . Hepatitis C Screening  28-Dec-1960  . INFLUENZA VACCINE  07/20/2015 (Originally 04/29/2015)  . HIV Screening  05/19/2016 (Originally 01/03/1976)  . MAMMOGRAM  05/09/2017  . COLONOSCOPY  05/17/2019  . TETANUS/TDAP  02/11/2022    The following portions of the patient's history were reviewed and updated as appropriate:  She  has a past medical history of Hypertension; Cataract; Asthma; Environmental allergies; and Allergy. She  does not have any pertinent problems on file. She  has past surgical history that includes Cataract extraction (Left) and Cystectomy (2013). Her family history includes Colon cancer in her father; Heart disease in her sister; Heart failure in her maternal grandfather; Hypertension in her mother; Ovarian cancer in her mother. She  reports that she has never smoked. She has never used smokeless tobacco. She reports that she drinks alcohol. She reports that she does not use illicit drugs. She has a current medication list which includes the following prescription(s): lisinopril-hydrochlorothiazide, multiple vitamins-minerals, fish oil, and probiotic product. Current Outpatient Prescriptions on File Prior to Visit  Medication Sig Dispense Refill  . Multiple Vitamins-Minerals (MULTIVITAMIN PO) Take by mouth.    . Omega-3  Fatty Acids (FISH OIL) 1200 MG CAPS Take by mouth.    . Probiotic Product (PROBIOTIC DAILY PO) Take by mouth.     No current facility-administered medications on file prior to visit.   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  Review of Systems Review of Systems  Constitutional: Negative for activity change, appetite change and fatigue.  HENT: Negative for hearing loss, congestion, tinnitus and ear discharge.  dentist q37m Eyes: Negative for visual disturbance (see optho q1y -- vision corrected to 20/20 with glasses).  Respiratory: Negative for cough, chest tightness and shortness of breath.   Cardiovascular: Negative for chest pain, palpitations and leg swelling.  Gastrointestinal: Negative for abdominal pain, diarrhea, constipation and abdominal distention.  Genitourinary: Negative for urgency, frequency, decreased urine volume and difficulty urinating.  Musculoskeletal: Negative for back pain, arthralgias and gait problem.  Skin: Negative for color change, pallor and rash.  Neurological: Negative for dizziness, light-headedness, numbness and headaches.  Hematological: Negative for adenopathy. Does not bruise/bleed easily.  Psychiatric/Behavioral: Negative for suicidal ideas, confusion, sleep disturbance, self-injury, dysphoric mood, decreased concentration and agitation.       Objective:    BP 118/88 mmHg  Pulse 86  Temp(Src) 98.8 F (37.1 C) (Oral)  Ht 5\' 6"  (1.676 m)  Wt 154 lb 12.8 oz (70.217 kg)  BMI 25.00 kg/m2  SpO2 95% General appearance: alert, cooperative, appears stated age and no distress Head: Normocephalic, without obvious abnormality, atraumatic Eyes: conjunctivae/corneas clear. PERRL, EOM's  intact. Fundi benign. Ears: normal TM's and external ear canals both ears Nose: Nares normal. Septum midline. Mucosa normal. No drainage or sinus tenderness. Throat: lips, mucosa, and tongue normal; teeth and gums normal Neck: no adenopathy, no carotid bruit, no JVD, supple,  symmetrical, trachea midline and thyroid not enlarged, symmetric, no tenderness/mass/nodules Back: symmetric, no curvature. ROM normal. No CVA tenderness. Lungs: clear to auscultation bilaterally Breasts: normal appearance, no masses or tenderness Heart: regular rate and rhythm, S1, S2 normal, no murmur, click, rub or gallop Abdomen: soft, non-tender; bowel sounds normal; no masses,  no organomegaly Pelvic: deferred Extremities: extremities normal, atraumatic, no cyanosis or edema Pulses: 2+ and symmetric Skin: Skin color, texture, turgor normal. No rashes or lesions Lymph nodes: Cervical, supraclavicular, and axillary nodes normal. Neurologic: Alert and oriented X 3, normal strength and tone. Normal symmetric reflexes. Normal coordination and gait Psych-- no depression,  No anxiety      Assessment:    Healthy female exam.      Plan:     See After Visit Summary for Counseling Recommendations  1. Preventative health care See AVS - Measles/Mumps/Rubella Immunity - Hepatitis B surface antibody - CBC with Differential/Platelet - Comprehensive metabolic panel - Lipid panel - TSH - POCT Urinalysis Dipstick  2. Essential hypertension  - lisinopril-hydrochlorothiazide (PRINZIDE,ZESTORETIC) 10-12.5 MG per tablet; Take 1 tablet by mouth daily.  Dispense: 90 tablet; Refill: 0 - Hepatitis B surface antibody - CBC with Differential/Platelet - Comprehensive metabolic panel - Lipid panel - TSH  3. Decreased vision  - Ambulatory referral to Ophthalmology

## 2015-05-20 NOTE — Patient Instructions (Signed)
Preventive Care for Adults A healthy lifestyle and preventive care can promote health and wellness. Preventive health guidelines for women include the following key practices.  A routine yearly physical is a good way to check with your health care provider about your health and preventive screening. It is a chance to share any concerns and updates on your health and to receive a thorough exam.  Visit your dentist for a routine exam and preventive care every 6 months. Brush your teeth twice a day and floss once a day. Good oral hygiene prevents tooth decay and gum disease.  The frequency of eye exams is based on your age, health, family medical history, use of contact lenses, and other factors. Follow your health care provider's recommendations for frequency of eye exams.  Eat a healthy diet. Foods like vegetables, fruits, whole grains, low-fat dairy products, and lean protein foods contain the nutrients you need without too many calories. Decrease your intake of foods high in solid fats, added sugars, and salt. Eat the right amount of calories for you.Get information about a proper diet from your health care provider, if necessary.  Regular physical exercise is one of the most important things you can do for your health. Most adults should get at least 150 minutes of moderate-intensity exercise (any activity that increases your heart rate and causes you to sweat) each week. In addition, most adults need muscle-strengthening exercises on 2 or more days a week.  Maintain a healthy weight. The body mass index (BMI) is a screening tool to identify possible weight problems. It provides an estimate of body fat based on height and weight. Your health care provider can find your BMI and can help you achieve or maintain a healthy weight.For adults 20 years and older:  A BMI below 18.5 is considered underweight.  A BMI of 18.5 to 24.9 is normal.  A BMI of 25 to 29.9 is considered overweight.  A BMI of  30 and above is considered obese.  Maintain normal blood lipids and cholesterol levels by exercising and minimizing your intake of saturated fat. Eat a balanced diet with plenty of fruit and vegetables. Blood tests for lipids and cholesterol should begin at age 76 and be repeated every 5 years. If your lipid or cholesterol levels are high, you are over 50, or you are at high risk for heart disease, you may need your cholesterol levels checked more frequently.Ongoing high lipid and cholesterol levels should be treated with medicines if diet and exercise are not working.  If you smoke, find out from your health care provider how to quit. If you do not use tobacco, do not start.  Lung cancer screening is recommended for adults aged 22-80 years who are at high risk for developing lung cancer because of a history of smoking. A yearly low-dose CT scan of the lungs is recommended for people who have at least a 30-pack-year history of smoking and are a current smoker or have quit within the past 15 years. A pack year of smoking is smoking an average of 1 pack of cigarettes a day for 1 year (for example: 1 pack a day for 30 years or 2 packs a day for 15 years). Yearly screening should continue until the smoker has stopped smoking for at least 15 years. Yearly screening should be stopped for people who develop a health problem that would prevent them from having lung cancer treatment.  If you are pregnant, do not drink alcohol. If you are breastfeeding,  be very cautious about drinking alcohol. If you are not pregnant and choose to drink alcohol, do not have more than 1 drink per day. One drink is considered to be 12 ounces (355 mL) of beer, 5 ounces (148 mL) of wine, or 1.5 ounces (44 mL) of liquor.  Avoid use of street drugs. Do not share needles with anyone. Ask for help if you need support or instructions about stopping the use of drugs.  High blood pressure causes heart disease and increases the risk of  stroke. Your blood pressure should be checked at least every 1 to 2 years. Ongoing high blood pressure should be treated with medicines if weight loss and exercise do not work.  If you are 75-52 years old, ask your health care provider if you should take aspirin to prevent strokes.  Diabetes screening involves taking a blood sample to check your fasting blood sugar level. This should be done once every 3 years, after age 15, if you are within normal weight and without risk factors for diabetes. Testing should be considered at a younger age or be carried out more frequently if you are overweight and have at least 1 risk factor for diabetes.  Breast cancer screening is essential preventive care for women. You should practice "breast self-awareness." This means understanding the normal appearance and feel of your breasts and may include breast self-examination. Any changes detected, no matter how small, should be reported to a health care provider. Women in their 58s and 30s should have a clinical breast exam (CBE) by a health care provider as part of a regular health exam every 1 to 3 years. After age 16, women should have a CBE every year. Starting at age 53, women should consider having a mammogram (breast X-ray test) every year. Women who have a family history of breast cancer should talk to their health care provider about genetic screening. Women at a high risk of breast cancer should talk to their health care providers about having an MRI and a mammogram every year.  Breast cancer gene (BRCA)-related cancer risk assessment is recommended for women who have family members with BRCA-related cancers. BRCA-related cancers include breast, ovarian, tubal, and peritoneal cancers. Having family members with these cancers may be associated with an increased risk for harmful changes (mutations) in the breast cancer genes BRCA1 and BRCA2. Results of the assessment will determine the need for genetic counseling and  BRCA1 and BRCA2 testing.  Routine pelvic exams to screen for cancer are no longer recommended for nonpregnant women who are considered low risk for cancer of the pelvic organs (ovaries, uterus, and vagina) and who do not have symptoms. Ask your health care provider if a screening pelvic exam is right for you.  If you have had past treatment for cervical cancer or a condition that could lead to cancer, you need Pap tests and screening for cancer for at least 20 years after your treatment. If Pap tests have been discontinued, your risk factors (such as having a new sexual partner) need to be reassessed to determine if screening should be resumed. Some women have medical problems that increase the chance of getting cervical cancer. In these cases, your health care provider may recommend more frequent screening and Pap tests.  The HPV test is an additional test that may be used for cervical cancer screening. The HPV test looks for the virus that can cause the cell changes on the cervix. The cells collected during the Pap test can be  tested for HPV. The HPV test could be used to screen women aged 30 years and older, and should be used in women of any age who have unclear Pap test results. After the age of 30, women should have HPV testing at the same frequency as a Pap test.  Colorectal cancer can be detected and often prevented. Most routine colorectal cancer screening begins at the age of 50 years and continues through age 75 years. However, your health care provider may recommend screening at an earlier age if you have risk factors for colon cancer. On a yearly basis, your health care provider may provide home test kits to check for hidden blood in the stool. Use of a small camera at the end of a tube, to directly examine the colon (sigmoidoscopy or colonoscopy), can detect the earliest forms of colorectal cancer. Talk to your health care provider about this at age 50, when routine screening begins. Direct  exam of the colon should be repeated every 5-10 years through age 75 years, unless early forms of pre-cancerous polyps or small growths are found.  People who are at an increased risk for hepatitis B should be screened for this virus. You are considered at high risk for hepatitis B if:  You were born in a country where hepatitis B occurs often. Talk with your health care provider about which countries are considered high risk.  Your parents were born in a high-risk country and you have not received a shot to protect against hepatitis B (hepatitis B vaccine).  You have HIV or AIDS.  You use needles to inject street drugs.  You live with, or have sex with, someone who has hepatitis B.  You get hemodialysis treatment.  You take certain medicines for conditions like cancer, organ transplantation, and autoimmune conditions.  Hepatitis C blood testing is recommended for all people born from 1945 through 1965 and any individual with known risks for hepatitis C.  Practice safe sex. Use condoms and avoid high-risk sexual practices to reduce the spread of sexually transmitted infections (STIs). STIs include gonorrhea, chlamydia, syphilis, trichomonas, herpes, HPV, and human immunodeficiency virus (HIV). Herpes, HIV, and HPV are viral illnesses that have no cure. They can result in disability, cancer, and death.  You should be screened for sexually transmitted illnesses (STIs) including gonorrhea and chlamydia if:  You are sexually active and are younger than 24 years.  You are older than 24 years and your health care provider tells you that you are at risk for this type of infection.  Your sexual activity has changed since you were last screened and you are at an increased risk for chlamydia or gonorrhea. Ask your health care provider if you are at risk.  If you are at risk of being infected with HIV, it is recommended that you take a prescription medicine daily to prevent HIV infection. This is  called preexposure prophylaxis (PrEP). You are considered at risk if:  You are a heterosexual woman, are sexually active, and are at increased risk for HIV infection.  You take drugs by injection.  You are sexually active with a partner who has HIV.  Talk with your health care provider about whether you are at high risk of being infected with HIV. If you choose to begin PrEP, you should first be tested for HIV. You should then be tested every 3 months for as long as you are taking PrEP.  Osteoporosis is a disease in which the bones lose minerals and strength   with aging. This can result in serious bone fractures or breaks. The risk of osteoporosis can be identified using a bone density scan. Women ages 65 years and over and women at risk for fractures or osteoporosis should discuss screening with their health care providers. Ask your health care provider whether you should take a calcium supplement or vitamin D to reduce the rate of osteoporosis.  Menopause can be associated with physical symptoms and risks. Hormone replacement therapy is available to decrease symptoms and risks. You should talk to your health care provider about whether hormone replacement therapy is right for you.  Use sunscreen. Apply sunscreen liberally and repeatedly throughout the day. You should seek shade when your shadow is shorter than you. Protect yourself by wearing long sleeves, pants, a wide-brimmed hat, and sunglasses year round, whenever you are outdoors.  Once a month, do a whole body skin exam, using a mirror to look at the skin on your back. Tell your health care provider of new moles, moles that have irregular borders, moles that are larger than a pencil eraser, or moles that have changed in shape or color.  Stay current with required vaccines (immunizations).  Influenza vaccine. All adults should be immunized every year.  Tetanus, diphtheria, and acellular pertussis (Td, Tdap) vaccine. Pregnant women should  receive 1 dose of Tdap vaccine during each pregnancy. The dose should be obtained regardless of the length of time since the last dose. Immunization is preferred during the 27th-36th week of gestation. An adult who has not previously received Tdap or who does not know her vaccine status should receive 1 dose of Tdap. This initial dose should be followed by tetanus and diphtheria toxoids (Td) booster doses every 10 years. Adults with an unknown or incomplete history of completing a 3-dose immunization series with Td-containing vaccines should begin or complete a primary immunization series including a Tdap dose. Adults should receive a Td booster every 10 years.  Varicella vaccine. An adult without evidence of immunity to varicella should receive 2 doses or a second dose if she has previously received 1 dose. Pregnant females who do not have evidence of immunity should receive the first dose after pregnancy. This first dose should be obtained before leaving the health care facility. The second dose should be obtained 4-8 weeks after the first dose.  Human papillomavirus (HPV) vaccine. Females aged 13-26 years who have not received the vaccine previously should obtain the 3-dose series. The vaccine is not recommended for use in pregnant females. However, pregnancy testing is not needed before receiving a dose. If a female is found to be pregnant after receiving a dose, no treatment is needed. In that case, the remaining doses should be delayed until after the pregnancy. Immunization is recommended for any person with an immunocompromised condition through the age of 26 years if she did not get any or all doses earlier. During the 3-dose series, the second dose should be obtained 4-8 weeks after the first dose. The third dose should be obtained 24 weeks after the first dose and 16 weeks after the second dose.  Zoster vaccine. One dose is recommended for adults aged 60 years or older unless certain conditions are  present.  Measles, mumps, and rubella (MMR) vaccine. Adults born before 1957 generally are considered immune to measles and mumps. Adults born in 1957 or later should have 1 or more doses of MMR vaccine unless there is a contraindication to the vaccine or there is laboratory evidence of immunity to   each of the three diseases. A routine second dose of MMR vaccine should be obtained at least 28 days after the first dose for students attending postsecondary schools, health care workers, or international travelers. People who received inactivated measles vaccine or an unknown type of measles vaccine during 1963-1967 should receive 2 doses of MMR vaccine. People who received inactivated mumps vaccine or an unknown type of mumps vaccine before 1979 and are at high risk for mumps infection should consider immunization with 2 doses of MMR vaccine. For females of childbearing age, rubella immunity should be determined. If there is no evidence of immunity, females who are not pregnant should be vaccinated. If there is no evidence of immunity, females who are pregnant should delay immunization until after pregnancy. Unvaccinated health care workers born before 1957 who lack laboratory evidence of measles, mumps, or rubella immunity or laboratory confirmation of disease should consider measles and mumps immunization with 2 doses of MMR vaccine or rubella immunization with 1 dose of MMR vaccine.  Pneumococcal 13-valent conjugate (PCV13) vaccine. When indicated, a person who is uncertain of her immunization history and has no record of immunization should receive the PCV13 vaccine. An adult aged 19 years or older who has certain medical conditions and has not been previously immunized should receive 1 dose of PCV13 vaccine. This PCV13 should be followed with a dose of pneumococcal polysaccharide (PPSV23) vaccine. The PPSV23 vaccine dose should be obtained at least 8 weeks after the dose of PCV13 vaccine. An adult aged 19  years or older who has certain medical conditions and previously received 1 or more doses of PPSV23 vaccine should receive 1 dose of PCV13. The PCV13 vaccine dose should be obtained 1 or more years after the last PPSV23 vaccine dose.  Pneumococcal polysaccharide (PPSV23) vaccine. When PCV13 is also indicated, PCV13 should be obtained first. All adults aged 65 years and older should be immunized. An adult younger than age 65 years who has certain medical conditions should be immunized. Any person who resides in a nursing home or long-term care facility should be immunized. An adult smoker should be immunized. People with an immunocompromised condition and certain other conditions should receive both PCV13 and PPSV23 vaccines. People with human immunodeficiency virus (HIV) infection should be immunized as soon as possible after diagnosis. Immunization during chemotherapy or radiation therapy should be avoided. Routine use of PPSV23 vaccine is not recommended for American Indians, [place] Natives, or people younger than 65 years unless there are medical conditions that require PPSV23 vaccine. When indicated, people who have unknown immunization and have no record of immunization should receive PPSV23 vaccine. One-time revaccination 5 years after the first dose of PPSV23 is recommended for people aged 19-64 years who have chronic kidney failure, nephrotic syndrome, asplenia, or immunocompromised conditions. People who received 1-2 doses of PPSV23 before age 65 years should receive another dose of PPSV23 vaccine at age 65 years or later if at least 5 years have passed since the previous dose. Doses of PPSV23 are not needed for people immunized with PPSV23 at or after age 65 years.  Meningococcal vaccine. Adults with asplenia or persistent complement component deficiencies should receive 2 doses of quadrivalent meningococcal conjugate (MenACWY-D) vaccine. The doses should be obtained at least 2 months apart.  Microbiologists working with certain meningococcal bacteria, military recruits, people at risk during an outbreak, and people who travel to or live in countries with a high rate of meningitis should be immunized. A first-year college student up through age   21 years who is living in a residence hall should receive a dose if she did not receive a dose on or after her 16th birthday. Adults who have certain high-risk conditions should receive one or more doses of vaccine.  Hepatitis A vaccine. Adults who wish to be protected from this disease, have certain high-risk conditions, work with hepatitis A-infected animals, work in hepatitis A research labs, or travel to or work in countries with a high rate of hepatitis A should be immunized. Adults who were previously unvaccinated and who anticipate close contact with an international adoptee during the first 60 days after arrival in the [place] from a country with a high rate of hepatitis A should be immunized.  Hepatitis B vaccine. Adults who wish to be protected from this disease, have certain high-risk conditions, may be exposed to blood or other infectious body fluids, are household contacts or sex partners of hepatitis B positive people, are clients or workers in certain care facilities, or travel to or work in countries with a high rate of hepatitis B should be immunized.  Haemophilus influenzae type b (Hib) vaccine. A previously unvaccinated person with asplenia or sickle cell disease or having a scheduled splenectomy should receive 1 dose of Hib vaccine. Regardless of previous immunization, a recipient of a hematopoietic stem cell transplant should receive a 3-dose series 6-12 months after her successful transplant. Hib vaccine is not recommended for adults with HIV infection. Preventive Services / Frequency Ages 64 to 68 years  Blood pressure check.** / Every 1 to 2 years.  Lipid and cholesterol check.** / Every 5 years beginning at age  22.  Clinical breast exam.** / Every 3 years for women in their 88s and 53s.  BRCA-related cancer risk assessment.** / For women who have family members with a BRCA-related cancer (breast, ovarian, tubal, or peritoneal cancers).  Pap test.** / Every 2 years from ages 90 through 51. Every 3 years starting at age 21 through age 56 or 3 with a history of 3 consecutive normal Pap tests.  HPV screening.** / Every 3 years from ages 24 through ages 1 to 46 with a history of 3 consecutive normal Pap tests.  Hepatitis C blood test.** / For any individual with known risks for hepatitis C.  Skin self-exam. / Monthly.  Influenza vaccine. / Every year.  Tetanus, diphtheria, and acellular pertussis (Tdap, Td) vaccine.** / Consult your health care provider. Pregnant women should receive 1 dose of Tdap vaccine during each pregnancy. 1 dose of Td every 10 years.  Varicella vaccine.** / Consult your health care provider. Pregnant females who do not have evidence of immunity should receive the first dose after pregnancy.  HPV vaccine. / 3 doses over 6 months, if 72 and younger. The vaccine is not recommended for use in pregnant females. However, pregnancy testing is not needed before receiving a dose.  Measles, mumps, rubella (MMR) vaccine.** / You need at least 1 dose of MMR if you were born in 1957 or later. You may also need a 2nd dose. For females of childbearing age, rubella immunity should be determined. If there is no evidence of immunity, females who are not pregnant should be vaccinated. If there is no evidence of immunity, females who are pregnant should delay immunization until after pregnancy.  Pneumococcal 13-valent conjugate (PCV13) vaccine.** / Consult your health care provider.  Pneumococcal polysaccharide (PPSV23) vaccine.** / 1 to 2 doses if you smoke cigarettes or if you have certain conditions.  Meningococcal vaccine.** /  1 dose if you are age 19 to 21 years and a first-year college  student living in a residence hall, or have one of several medical conditions, you need to get vaccinated against meningococcal disease. You may also need additional booster doses.  Hepatitis A vaccine.** / Consult your health care provider.  Hepatitis B vaccine.** / Consult your health care provider.  Haemophilus influenzae type b (Hib) vaccine.** / Consult your health care provider. Ages 40 to 64 years  Blood pressure check.** / Every 1 to 2 years.  Lipid and cholesterol check.** / Every 5 years beginning at age 20 years.  Lung cancer screening. / Every year if you are aged 55-80 years and have a 30-pack-year history of smoking and currently smoke or have quit within the past 15 years. Yearly screening is stopped once you have quit smoking for at least 15 years or develop a health problem that would prevent you from having lung cancer treatment.  Clinical breast exam.** / Every year after age 40 years.  BRCA-related cancer risk assessment.** / For women who have family members with a BRCA-related cancer (breast, ovarian, tubal, or peritoneal cancers).  Mammogram.** / Every year beginning at age 40 years and continuing for as long as you are in good health. Consult with your health care provider.  Pap test.** / Every 3 years starting at age 30 years through age 65 or 70 years with a history of 3 consecutive normal Pap tests.  HPV screening.** / Every 3 years from ages 30 years through ages 65 to 70 years with a history of 3 consecutive normal Pap tests.  Fecal occult blood test (FOBT) of stool. / Every year beginning at age 50 years and continuing until age 75 years. You may not need to do this test if you get a colonoscopy every 10 years.  Flexible sigmoidoscopy or colonoscopy.** / Every 5 years for a flexible sigmoidoscopy or every 10 years for a colonoscopy beginning at age 50 years and continuing until age 75 years.  Hepatitis C blood test.** / For all people born from 1945 through  1965 and any individual with known risks for hepatitis C.  Skin self-exam. / Monthly.  Influenza vaccine. / Every year.  Tetanus, diphtheria, and acellular pertussis (Tdap/Td) vaccine.** / Consult your health care provider. Pregnant women should receive 1 dose of Tdap vaccine during each pregnancy. 1 dose of Td every 10 years.  Varicella vaccine.** / Consult your health care provider. Pregnant females who do not have evidence of immunity should receive the first dose after pregnancy.  Zoster vaccine.** / 1 dose for adults aged 60 years or older.  Measles, mumps, rubella (MMR) vaccine.** / You need at least 1 dose of MMR if you were born in 1957 or later. You may also need a 2nd dose. For females of childbearing age, rubella immunity should be determined. If there is no evidence of immunity, females who are not pregnant should be vaccinated. If there is no evidence of immunity, females who are pregnant should delay immunization until after pregnancy.  Pneumococcal 13-valent conjugate (PCV13) vaccine.** / Consult your health care provider.  Pneumococcal polysaccharide (PPSV23) vaccine.** / 1 to 2 doses if you smoke cigarettes or if you have certain conditions.  Meningococcal vaccine.** / Consult your health care provider.  Hepatitis A vaccine.** / Consult your health care provider.  Hepatitis B vaccine.** / Consult your health care provider.  Haemophilus influenzae type b (Hib) vaccine.** / Consult your health care provider. Ages 65   years and over  Blood pressure check.** / Every 1 to 2 years.  Lipid and cholesterol check.** / Every 5 years beginning at age 22 years.  Lung cancer screening. / Every year if you are aged 73-80 years and have a 30-pack-year history of smoking and currently smoke or have quit within the past 15 years. Yearly screening is stopped once you have quit smoking for at least 15 years or develop a health problem that would prevent you from having lung cancer  treatment.  Clinical breast exam.** / Every year after age 4 years.  BRCA-related cancer risk assessment.** / For women who have family members with a BRCA-related cancer (breast, ovarian, tubal, or peritoneal cancers).  Mammogram.** / Every year beginning at age 40 years and continuing for as long as you are in good health. Consult with your health care provider.  Pap test.** / Every 3 years starting at age 9 years through age 34 or 91 years with 3 consecutive normal Pap tests. Testing can be stopped between 65 and 70 years with 3 consecutive normal Pap tests and no abnormal Pap or HPV tests in the past 10 years.  HPV screening.** / Every 3 years from ages 57 years through ages 64 or 45 years with a history of 3 consecutive normal Pap tests. Testing can be stopped between 65 and 70 years with 3 consecutive normal Pap tests and no abnormal Pap or HPV tests in the past 10 years.  Fecal occult blood test (FOBT) of stool. / Every year beginning at age 15 years and continuing until age 17 years. You may not need to do this test if you get a colonoscopy every 10 years.  Flexible sigmoidoscopy or colonoscopy.** / Every 5 years for a flexible sigmoidoscopy or every 10 years for a colonoscopy beginning at age 86 years and continuing until age 71 years.  Hepatitis C blood test.** / For all people born from [birth year] through 1967 and any individual with known risks for hepatitis C.  Osteoporosis screening.** / A one-time screening for women ages 83 years and over and women at risk for fractures or osteoporosis.  Skin self-exam. / Monthly.  Influenza vaccine. / Every year.  Tetanus, diphtheria, and acellular pertussis (Tdap/Td) vaccine.** / 1 dose of Td every 10 years.  Varicella vaccine.** / Consult your health care provider.  Zoster vaccine.** / 1 dose for adults aged 61 years or older.  Pneumococcal 13-valent conjugate (PCV13) vaccine.** / Consult your health care provider.  Pneumococcal  polysaccharide (PPSV23) vaccine.** / 1 dose for all adults aged 28 years and older.  Meningococcal vaccine.** / Consult your health care provider.  Hepatitis A vaccine.** / Consult your health care provider.  Hepatitis B vaccine.** / Consult your health care provider.  Haemophilus influenzae type b (Hib) vaccine.** / Consult your health care provider. ** Family history and personal history of risk and conditions may change your health care provider's recommendations. Document Released: 11/10/2001 Document Revised: 01/29/2014 Document Reviewed: 02/09/2011 [facility] Patient Information 2015 [place], [place]. This information is not intended to replace advice given to you by your health care provider. Make sure you discuss any questions you have with your health care provider.

## 2015-05-20 NOTE — Progress Notes (Signed)
Pre visit review using our clinic review tool, if applicable. No additional management support is needed unless otherwise documented below in the visit note. 

## 2015-05-21 ENCOUNTER — Encounter: Payer: Self-pay | Admitting: Family Medicine

## 2015-05-21 LAB — COMPREHENSIVE METABOLIC PANEL
ALBUMIN: 4.5 g/dL (ref 3.5–5.2)
ALK PHOS: 51 U/L (ref 39–117)
ALT: 21 U/L (ref 0–35)
AST: 23 U/L (ref 0–37)
BUN: 12 mg/dL (ref 6–23)
CALCIUM: 10.1 mg/dL (ref 8.4–10.5)
CO2: 31 mEq/L (ref 19–32)
Chloride: 96 mEq/L (ref 96–112)
Creatinine, Ser: 0.67 mg/dL (ref 0.40–1.20)
GFR: 116.93 mL/min (ref >60.00)
Glucose, Bld: 73 mg/dL (ref 70–99)
Potassium: 3.6 mEq/L (ref 3.5–5.1)
Sodium: 135 mEq/L (ref 135–145)
Total Bilirubin: 0.6 mg/dL (ref 0.2–1.2)
Total Protein: 8.1 g/dL (ref 6.0–8.3)

## 2015-05-21 LAB — CBC WITH DIFFERENTIAL/PLATELET
BASOS ABS: 0 10*3/uL (ref 0.0–0.1)
Basophils Relative: 0.5 % (ref 0.0–3.0)
Eosinophils Absolute: 0.3 10*3/uL (ref 0.0–0.7)
Eosinophils Relative: 3.7 % (ref 0.0–5.0)
HEMATOCRIT: 43.1 % (ref 36.0–46.0)
HEMOGLOBIN: 14 g/dL (ref 12.0–15.0)
LYMPHS PCT: 36.4 % (ref 12.0–46.0)
Lymphs Abs: 2.5 10*3/uL (ref 0.7–4.0)
MCHC: 32.5 g/dL (ref 30.0–36.0)
MCV: 85.2 fl (ref 78.0–100.0)
MONOS PCT: 11.8 % (ref 3.0–12.0)
Monocytes Absolute: 0.8 10*3/uL (ref 0.1–1.0)
NEUTROS ABS: 3.3 10*3/uL (ref 1.4–7.7)
Neutrophils Relative %: 47.6 % (ref 43.0–77.0)
Platelets: 313 10*3/uL (ref 150.0–400.0)
RBC: 5.06 Mil/uL (ref 3.87–5.11)
RDW: 14.4 % (ref 11.5–15.5)
WBC: 6.9 10*3/uL (ref 4.0–10.5)

## 2015-05-21 LAB — LIPID PANEL
Cholesterol: 250 mg/dL — ABNORMAL HIGH (ref 0–200)
HDL: 84.1 mg/dL (ref >39.00)
LDL Cholesterol: 147 mg/dL — ABNORMAL HIGH (ref 0–99)
NonHDL: 165.81
TRIGLYCERIDES: 95 mg/dL (ref 0.0–149.0)
Total CHOL/HDL Ratio: 3
VLDL: 19 mg/dL (ref 0.0–40.0)

## 2015-05-21 LAB — MEASLES/MUMPS/RUBELLA IMMUNITY
Mumps IgG: 12 AU/mL — ABNORMAL HIGH (ref <9.00)
Rubella: 3.57 Index — ABNORMAL HIGH (ref <0.90)

## 2015-05-21 LAB — TSH: TSH: 2.13 u[IU]/mL (ref 0.35–4.50)

## 2015-05-21 LAB — HEPATITIS B SURFACE ANTIBODY,QUALITATIVE: Hep B S Ab: NEGATIVE

## 2015-05-28 ENCOUNTER — Telehealth: Payer: Self-pay | Admitting: Family Medicine

## 2015-05-28 NOTE — Telephone Encounter (Signed)
See results.     KP 

## 2015-05-28 NOTE — Telephone Encounter (Signed)
Relation to RK:YHCW Call back [phone]   Reason for call:  Patient returning your call patient states it may be regarding lab results.

## 2016-02-12 ENCOUNTER — Other Ambulatory Visit: Payer: Self-pay | Admitting: Family Medicine

## 2016-02-12 NOTE — Telephone Encounter (Signed)
Rx sent to the pharmacy by e-script.//AB/CMA 

## 2016-03-26 DIAGNOSIS — K080 Exfoliation of teeth due to systemic causes: Secondary | ICD-10-CM | POA: Diagnosis not present

## 2016-05-22 ENCOUNTER — Ambulatory Visit (INDEPENDENT_AMBULATORY_CARE_PROVIDER_SITE_OTHER): Payer: Federal, State, Local not specified - PPO | Admitting: Family Medicine

## 2016-05-22 ENCOUNTER — Encounter: Payer: Self-pay | Admitting: Family Medicine

## 2016-05-22 VITALS — BP 112/68 | HR 69 | Temp 97.9°F | Ht 66.0 in | Wt 149.0 lb

## 2016-05-22 DIAGNOSIS — I10 Essential (primary) hypertension: Secondary | ICD-10-CM | POA: Diagnosis not present

## 2016-05-22 DIAGNOSIS — Z0000 Encounter for general adult medical examination without abnormal findings: Secondary | ICD-10-CM | POA: Diagnosis not present

## 2016-05-22 DIAGNOSIS — Z1159 Encounter for screening for other viral diseases: Secondary | ICD-10-CM | POA: Diagnosis not present

## 2016-05-22 LAB — LIPID PANEL
CHOLESTEROL: 258 mg/dL — AB (ref 0–200)
HDL: 74.7 mg/dL (ref >39.00)
LDL CALC: 157 mg/dL — AB (ref 0–99)
NONHDL: 183.04
Total CHOL/HDL Ratio: 3
Triglycerides: 129 mg/dL (ref 0.0–149.0)
VLDL: 25.8 mg/dL (ref 0.0–40.0)

## 2016-05-22 LAB — CBC WITH DIFFERENTIAL/PLATELET
BASOS PCT: 0.6 % (ref 0.0–3.0)
Basophils Absolute: 0 10*3/uL (ref 0.0–0.1)
EOS PCT: 1.5 % (ref 0.0–5.0)
Eosinophils Absolute: 0.1 10*3/uL (ref 0.0–0.7)
HCT: 41.9 % (ref 36.0–46.0)
HEMOGLOBIN: 13.9 g/dL (ref 12.0–15.0)
LYMPHS ABS: 2 10*3/uL (ref 0.7–4.0)
Lymphocytes Relative: 34.4 % (ref 12.0–46.0)
MCHC: 33.1 g/dL (ref 30.0–36.0)
MCV: 83.1 fl (ref 78.0–100.0)
MONO ABS: 0.8 10*3/uL (ref 0.1–1.0)
MONOS PCT: 14.8 % — AB (ref 3.0–12.0)
NEUTROS PCT: 48.7 % (ref 43.0–77.0)
Neutro Abs: 2.8 10*3/uL (ref 1.4–7.7)
Platelets: 317 10*3/uL (ref 150.0–400.0)
RBC: 5.04 Mil/uL (ref 3.87–5.11)
RDW: 14.3 % (ref 11.5–15.5)
WBC: 5.7 10*3/uL (ref 4.0–10.5)

## 2016-05-22 LAB — COMPREHENSIVE METABOLIC PANEL
ALBUMIN: 4.4 g/dL (ref 3.5–5.2)
ALK PHOS: 54 U/L (ref 39–117)
ALT: 23 U/L (ref 0–35)
AST: 22 U/L (ref 0–37)
BUN: 10 mg/dL (ref 6–23)
CHLORIDE: 96 meq/L (ref 96–112)
CO2: 32 mEq/L (ref 19–32)
Calcium: 9.6 mg/dL (ref 8.4–10.5)
Creatinine, Ser: 0.79 mg/dL (ref 0.40–1.20)
GFR: 96.34 mL/min (ref >60.00)
Glucose, Bld: 79 mg/dL (ref 70–99)
POTASSIUM: 3.7 meq/L (ref 3.5–5.1)
SODIUM: 134 meq/L — AB (ref 135–145)
TOTAL PROTEIN: 8.1 g/dL (ref 6.0–8.3)
Total Bilirubin: 0.8 mg/dL (ref 0.2–1.2)

## 2016-05-22 LAB — TSH: TSH: 1.56 u[IU]/mL (ref 0.35–4.50)

## 2016-05-22 LAB — POCT URINALYSIS DIPSTICK
BILIRUBIN UA: NEGATIVE
Glucose, UA: NEGATIVE
KETONES UA: NEGATIVE
LEUKOCYTES UA: NEGATIVE
NITRITE UA: NEGATIVE
PH UA: 6
Protein, UA: NEGATIVE
RBC UA: NEGATIVE
SPEC GRAV UA: 1.025
Urobilinogen, UA: NEGATIVE

## 2016-05-22 MED ORDER — LISINOPRIL-HYDROCHLOROTHIAZIDE 10-12.5 MG PO TABS: 1.0000 | ORAL_TABLET | Freq: Every day | ORAL | 3 refills | Status: DC

## 2016-05-22 NOTE — Progress Notes (Signed)
Subjective:     Savannah Deleon is a 55 y.o. female and is here for a comprehensive physical exam. The patient reports no problems.  Social History   Social History  . Marital status: Single    Spouse name: N/A  . Number of children: N/A  . Years of education: N/A   Occupational History  . retired    Social History Main Topics  . Smoking status: Never Smoker  . Smokeless tobacco: Never Used  . Alcohol use Yes     Comment: Seldom  . Drug use: No  . Sexual activity: Yes    Partners: Male   Other Topics Concern  . Not on file   Social History Narrative   Exercise--limited,  Just moved from [place]               Health Maintenance  Topic Date Due  . HIV Screening  01/03/1974  . INFLUENZA VACCINE  04/28/2016  . Hepatitis C Screening  05/27/2016 (Originally [birth year])  . MAMMOGRAM  05/09/2017  . PAP SMEAR  05/29/2018  . COLONOSCOPY  05/17/2019  . TETANUS/TDAP  02/11/2022    The following portions of the patient's history were reviewed and updated as appropriate:  She  has a past medical history of Allergy; Asthma; Cataract; Environmental allergies; and Hypertension. She  does not have any pertinent problems on file. She  has a past surgical history that includes Cataract extraction (Left) and Cystectomy (2013). Her family history includes Colon cancer in her father; Heart disease in her sister; Heart failure in her maternal grandfather; Hypertension in her mother; Ovarian cancer in her mother. She  reports that she has never smoked. She has never used smokeless tobacco. She reports that she drinks alcohol. She reports that she does not use drugs. She has a current medication list which includes the following prescription(s): lisinopril-hydrochlorothiazide, multiple vitamins-minerals, fish oil, and probiotic product. Current Outpatient Prescriptions on File Prior to Visit  Medication Sig Dispense Refill  . Multiple Vitamins-Minerals (MULTIVITAMIN PO) Take by mouth.    . Omega-3  Fatty Acids (FISH OIL) 1200 MG CAPS Take by mouth.    . Probiotic Product (PROBIOTIC DAILY PO) Take by mouth.     No current facility-administered medications on file prior to visit.    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  Review of Systems Review of Systems  Constitutional: Negative for activity change, appetite change and fatigue.  HENT: Negative for hearing loss, congestion, tinnitus and ear discharge.  dentist q42m Eyes: Negative for visual disturbance (see optho q1y -- vision corrected to 20/20 with glasses).  Respiratory: Negative for cough, chest tightness and shortness of breath.   Cardiovascular: Negative for chest pain, palpitations and leg swelling.  Gastrointestinal: Negative for abdominal pain, diarrhea, constipation and abdominal distention.  Genitourinary: Negative for urgency, frequency, decreased urine volume and difficulty urinating.  Musculoskeletal: Negative for back pain, arthralgias and gait problem.  Skin: Negative for color change, pallor and rash.  Neurological: Negative for dizziness, light-headedness, numbness and headaches.  Hematological: Negative for adenopathy. Does not bruise/bleed easily.  Psychiatric/Behavioral: Negative for suicidal ideas, confusion, sleep disturbance, self-injury, dysphoric mood, decreased concentration and agitation.       Objective:    BP 112/68 (BP Location: Left Arm, Patient Position: Sitting, Cuff Size: Normal)   Pulse 69   Temp 97.9 F (36.6 C) (Oral)   Ht 5\' 6"  (1.676 m)   Wt 149 lb (67.6 kg)   LMP 06/28/2013   SpO2 99%   BMI 24.05  kg/m  General appearance: alert, cooperative, appears stated age and no distress Head: Normocephalic, without obvious abnormality, atraumatic Eyes: conjunctivae/corneas clear. PERRL, EOM's intact. Fundi benign. Ears: normal TM's and external ear canals both ears Nose: Nares normal. Septum midline. Mucosa normal. No drainage or sinus tenderness. Throat: lips, mucosa, and tongue normal; teeth and  gums normal Neck: no adenopathy, no carotid bruit, no JVD, supple, symmetrical, trachea midline and thyroid not enlarged, symmetric, no tenderness/mass/nodules Back: symmetric, no curvature. ROM normal. No CVA tenderness. Lungs: clear to auscultation bilaterally Breasts: gyn Heart: +s1S2  No murmur Abdomen: soft, non-tender; bowel sounds normal; no masses,  no organomegaly Pelvic: deferred--gyn Extremities: extremities normal, atraumatic, no cyanosis or edema Pulses: 2+ and symmetric Skin: Skin color, texture, turgor normal. No rashes or lesions Lymph nodes: Cervical, supraclavicular, and axillary nodes normal. Neurologic: Alert and oriented X 3, normal strength and tone. Normal symmetric reflexes. Normal coordination and gait    Assessment:    Healthy female exam.      Plan:    ghm utd Check labs See After Visit Summary for Counseling Recommendations    1. Preventative health care See above  - Lipid panel - CBC with Differential/Platelet - Comprehensive metabolic panel - POCT urinalysis dipstick - TSH  2. Essential hypertension stable - lisinopril-hydrochlorothiazide (PRINZIDE,ZESTORETIC) 10-12.5 MG tablet; Take 1 tablet by mouth daily.  Dispense: 90 tablet; Refill: 3

## 2016-05-22 NOTE — Patient Instructions (Signed)
Preventive Care for Adults, Female A healthy lifestyle and preventive care can promote health and wellness. Preventive health guidelines for women include the following key practices.  A routine yearly physical is a good way to check with your health care provider about your health and preventive screening. It is a chance to share any concerns and updates on your health and to receive a thorough exam.  Visit your dentist for a routine exam and preventive care every 6 months. Brush your teeth twice a day and floss once a day. Good oral hygiene prevents tooth decay and gum disease.  The frequency of eye exams is based on your age, health, family medical history, use of contact lenses, and other factors. Follow your health care provider's recommendations for frequency of eye exams.  Eat a healthy diet. Foods like vegetables, fruits, whole grains, low-fat dairy products, and lean protein foods contain the nutrients you need without too many calories. Decrease your intake of foods high in solid fats, added sugars, and salt. Eat the right amount of calories for you.Get information about a proper diet from your health care provider, if necessary.  Regular physical exercise is one of the most important things you can do for your health. Most adults should get at least 150 minutes of moderate-intensity exercise (any activity that increases your heart rate and causes you to sweat) each week. In addition, most adults need muscle-strengthening exercises on 2 or more days a week.  Maintain a healthy weight. The body mass index (BMI) is a screening tool to identify possible weight problems. It provides an estimate of body fat based on height and weight. Your health care provider can find your BMI and can help you achieve or maintain a healthy weight.For adults 20 years and older:  A BMI below 18.5 is considered underweight.  A BMI of 18.5 to 24.9 is normal.  A BMI of 25 to 29.9 is considered overweight.  A  BMI of 30 and above is considered obese.  Maintain normal blood lipids and cholesterol levels by exercising and minimizing your intake of saturated fat. Eat a balanced diet with plenty of fruit and vegetables. Blood tests for lipids and cholesterol should begin at age 45 and be repeated every 5 years. If your lipid or cholesterol levels are high, you are over 50, or you are at high risk for heart disease, you may need your cholesterol levels checked more frequently.Ongoing high lipid and cholesterol levels should be treated with medicines if diet and exercise are not working.  If you smoke, find out from your health care provider how to quit. If you do not use tobacco, do not start.  Lung cancer screening is recommended for adults aged 45-80 years who are at high risk for developing lung cancer because of a history of smoking. A yearly low-dose CT scan of the lungs is recommended for people who have at least a 30-pack-year history of smoking and are a current smoker or have quit within the past 15 years. A pack year of smoking is smoking an average of 1 pack of cigarettes a day for 1 year (for example: 1 pack a day for 30 years or 2 packs a day for 15 years). Yearly screening should continue until the smoker has stopped smoking for at least 15 years. Yearly screening should be stopped for people who develop a health problem that would prevent them from having lung cancer treatment.  If you are pregnant, do not drink alcohol. If you are  breastfeeding, be very cautious about drinking alcohol. If you are not pregnant and choose to drink alcohol, do not have more than 1 drink per day. One drink is considered to be 12 ounces (355 mL) of beer, 5 ounces (148 mL) of wine, or 1.5 ounces (44 mL) of liquor.  Avoid use of street drugs. Do not share needles with anyone. Ask for help if you need support or instructions about stopping the use of drugs.  High blood pressure causes heart disease and increases the risk  of stroke. Your blood pressure should be checked at least every 1 to 2 years. Ongoing high blood pressure should be treated with medicines if weight loss and exercise do not work.  If you are 55-79 years old, ask your health care provider if you should take aspirin to prevent strokes.  Diabetes screening is done by taking a blood sample to check your blood glucose level after you have not eaten for a certain period of time (fasting). If you are not overweight and you do not have risk factors for diabetes, you should be screened once every 3 years starting at age 45. If you are overweight or obese and you are 40-70 years of age, you should be screened for diabetes every year as part of your cardiovascular risk assessment.  Breast cancer screening is essential preventive care for women. You should practice "breast self-awareness." This means understanding the normal appearance and feel of your breasts and may include breast self-examination. Any changes detected, no matter how small, should be reported to a health care provider. Women in their 20s and 30s should have a clinical breast exam (CBE) by a health care provider as part of a regular health exam every 1 to 3 years. After age 40, women should have a CBE every year. Starting at age 40, women should consider having a mammogram (breast X-ray test) every year. Women who have a family history of breast cancer should talk to their health care provider about genetic screening. Women at a high risk of breast cancer should talk to their health care providers about having an MRI and a mammogram every year.  Breast cancer gene (BRCA)-related cancer risk assessment is recommended for women who have family members with BRCA-related cancers. BRCA-related cancers include breast, ovarian, tubal, and peritoneal cancers. Having family members with these cancers may be associated with an increased risk for harmful changes (mutations) in the breast cancer genes BRCA1 and  BRCA2. Results of the assessment will determine the need for genetic counseling and BRCA1 and BRCA2 testing.  Your health care provider may recommend that you be screened regularly for cancer of the pelvic organs (ovaries, uterus, and vagina). This screening involves a pelvic examination, including checking for microscopic changes to the surface of your cervix (Pap test). You may be encouraged to have this screening done every 3 years, beginning at age 21.  For women ages 30-65, health care providers may recommend pelvic exams and Pap testing every 3 years, or they may recommend the Pap and pelvic exam, combined with testing for human papilloma virus (HPV), every 5 years. Some types of HPV increase your risk of cervical cancer. Testing for HPV may also be done on women of any age with unclear Pap test results.  Other health care providers may not recommend any screening for nonpregnant women who are considered low risk for pelvic cancer and who do not have symptoms. Ask your health care provider if a screening pelvic exam is right for   you.  If you have had past treatment for cervical cancer or a condition that could lead to cancer, you need Pap tests and screening for cancer for at least 20 years after your treatment. If Pap tests have been discontinued, your risk factors (such as having a new sexual partner) need to be reassessed to determine if screening should resume. Some women have medical problems that increase the chance of getting cervical cancer. In these cases, your health care provider may recommend more frequent screening and Pap tests.  Colorectal cancer can be detected and often prevented. Most routine colorectal cancer screening begins at the age of 50 years and continues through age 75 years. However, your health care provider may recommend screening at an earlier age if you have risk factors for colon cancer. On a yearly basis, your health care provider may provide home test kits to check  for hidden blood in the stool. Use of a small camera at the end of a tube, to directly examine the colon (sigmoidoscopy or colonoscopy), can detect the earliest forms of colorectal cancer. Talk to your health care provider about this at age 50, when routine screening begins. Direct exam of the colon should be repeated every 5-10 years through age 75 years, unless early forms of precancerous polyps or small growths are found.  People who are at an increased risk for hepatitis B should be screened for this virus. You are considered at high risk for hepatitis B if:  You were born in a country where hepatitis B occurs often. Talk with your health care provider about which countries are considered high risk.  Your parents were born in a high-risk country and you have not received a shot to protect against hepatitis B (hepatitis B vaccine).  You have HIV or AIDS.  You use needles to inject street drugs.  You live with, or have sex with, someone who has hepatitis B.  You get hemodialysis treatment.  You take certain medicines for conditions like cancer, organ transplantation, and autoimmune conditions.  Hepatitis C blood testing is recommended for all people born from 1945 through 1965 and any individual with known risks for hepatitis C.  Practice safe sex. Use condoms and avoid high-risk sexual practices to reduce the spread of sexually transmitted infections (STIs). STIs include gonorrhea, chlamydia, syphilis, trichomonas, herpes, HPV, and human immunodeficiency virus (HIV). Herpes, HIV, and HPV are viral illnesses that have no cure. They can result in disability, cancer, and death.  You should be screened for sexually transmitted illnesses (STIs) including gonorrhea and chlamydia if:  You are sexually active and are younger than 24 years.  You are older than 24 years and your health care provider tells you that you are at risk for this type of infection.  Your sexual activity has changed  since you were last screened and you are at an increased risk for chlamydia or gonorrhea. Ask your health care provider if you are at risk.  If you are at risk of being infected with HIV, it is recommended that you take a prescription medicine daily to prevent HIV infection. This is called preexposure prophylaxis (PrEP). You are considered at risk if:  You are sexually active and do not regularly use condoms or know the HIV status of your partner(s).  You take drugs by injection.  You are sexually active with a partner who has HIV.  Talk with your health care provider about whether you are at high risk of being infected with HIV. If   you choose to begin PrEP, you should first be tested for HIV. You should then be tested every 3 months for as long as you are taking PrEP.  Osteoporosis is a disease in which the bones lose minerals and strength with aging. This can result in serious bone fractures or breaks. The risk of osteoporosis can be identified using a bone density scan. Women ages 67 years and over and women at risk for fractures or osteoporosis should discuss screening with their health care providers. Ask your health care provider whether you should take a calcium supplement or vitamin D to reduce the rate of osteoporosis.  Menopause can be associated with physical symptoms and risks. Hormone replacement therapy is available to decrease symptoms and risks. You should talk to your health care provider about whether hormone replacement therapy is right for you.  Use sunscreen. Apply sunscreen liberally and repeatedly throughout the day. You should seek shade when your shadow is shorter than you. Protect yourself by wearing long sleeves, pants, a wide-brimmed hat, and sunglasses year round, whenever you are outdoors.  Once a month, do a whole body skin exam, using a mirror to look at the skin on your back. Tell your health care provider of new moles, moles that have irregular borders, moles that  are larger than a pencil eraser, or moles that have changed in shape or color.  Stay current with required vaccines (immunizations).  Influenza vaccine. All adults should be immunized every year.  Tetanus, diphtheria, and acellular pertussis (Td, Tdap) vaccine. Pregnant women should receive 1 dose of Tdap vaccine during each pregnancy. The dose should be obtained regardless of the length of time since the last dose. Immunization is preferred during the 27th-36th week of gestation. An adult who has not previously received Tdap or who does not know her vaccine status should receive 1 dose of Tdap. This initial dose should be followed by tetanus and diphtheria toxoids (Td) booster doses every 10 years. Adults with an unknown or incomplete history of completing a 3-dose immunization series with Td-containing vaccines should begin or complete a primary immunization series including a Tdap dose. Adults should receive a Td booster every 10 years.  Varicella vaccine. An adult without evidence of immunity to varicella should receive 2 doses or a second dose if she has previously received 1 dose. Pregnant females who do not have evidence of immunity should receive the first dose after pregnancy. This first dose should be obtained before leaving the health care facility. The second dose should be obtained 4-8 weeks after the first dose.  Human papillomavirus (HPV) vaccine. Females aged 13-26 years who have not received the vaccine previously should obtain the 3-dose series. The vaccine is not recommended for use in pregnant females. However, pregnancy testing is not needed before receiving a dose. If a female is found to be pregnant after receiving a dose, no treatment is needed. In that case, the remaining doses should be delayed until after the pregnancy. Immunization is recommended for any person with an immunocompromised condition through the age of 61 years if she did not get any or all doses earlier. During the  3-dose series, the second dose should be obtained 4-8 weeks after the first dose. The third dose should be obtained 24 weeks after the first dose and 16 weeks after the second dose.  Zoster vaccine. One dose is recommended for adults aged 30 years or older unless certain conditions are present.  Measles, mumps, and rubella (MMR) vaccine. Adults born  before 1957 generally are considered immune to measles and mumps. Adults born in 1957 or later should have 1 or more doses of MMR vaccine unless there is a contraindication to the vaccine or there is laboratory evidence of immunity to each of the three diseases. A routine second dose of MMR vaccine should be obtained at least 28 days after the first dose for students attending postsecondary schools, health care workers, or international travelers. People who received inactivated measles vaccine or an unknown type of measles vaccine during 1963-1967 should receive 2 doses of MMR vaccine. People who received inactivated mumps vaccine or an unknown type of mumps vaccine before 1979 and are at high risk for mumps infection should consider immunization with 2 doses of MMR vaccine. For females of childbearing age, rubella immunity should be determined. If there is no evidence of immunity, females who are not pregnant should be vaccinated. If there is no evidence of immunity, females who are pregnant should delay immunization until after pregnancy. Unvaccinated health care workers born before 1957 who lack laboratory evidence of measles, mumps, or rubella immunity or laboratory confirmation of disease should consider measles and mumps immunization with 2 doses of MMR vaccine or rubella immunization with 1 dose of MMR vaccine.  Pneumococcal 13-valent conjugate (PCV13) vaccine. When indicated, a person who is uncertain of his immunization history and has no record of immunization should receive the PCV13 vaccine. All adults 65 years of age and older should receive this  vaccine. An adult aged 19 years or older who has certain medical conditions and has not been previously immunized should receive 1 dose of PCV13 vaccine. This PCV13 should be followed with a dose of pneumococcal polysaccharide (PPSV23) vaccine. Adults who are at high risk for pneumococcal disease should obtain the PPSV23 vaccine at least 8 weeks after the dose of PCV13 vaccine. Adults older than 57 years of age who have normal immune system function should obtain the PPSV23 vaccine dose at least 1 year after the dose of PCV13 vaccine.  Pneumococcal polysaccharide (PPSV23) vaccine. When PCV13 is also indicated, PCV13 should be obtained first. All adults aged 65 years and older should be immunized. An adult younger than age 65 years who has certain medical conditions should be immunized. Any person who resides in a nursing home or long-term care facility should be immunized. An adult smoker should be immunized. People with an immunocompromised condition and certain other conditions should receive both PCV13 and PPSV23 vaccines. People with human immunodeficiency virus (HIV) infection should be immunized as soon as possible after diagnosis. Immunization during chemotherapy or radiation therapy should be avoided. Routine use of PPSV23 vaccine is not recommended for American Indians, [place] Natives, or people younger than 65 years unless there are medical conditions that require PPSV23 vaccine. When indicated, people who have unknown immunization and have no record of immunization should receive PPSV23 vaccine. One-time revaccination 5 years after the first dose of PPSV23 is recommended for people aged 19-64 years who have chronic kidney failure, nephrotic syndrome, asplenia, or immunocompromised conditions. People who received 1-2 doses of PPSV23 before age 65 years should receive another dose of PPSV23 vaccine at age 65 years or later if at least 5 years have passed since the previous dose. Doses of PPSV23 are not  needed for people immunized with PPSV23 at or after age 65 years.  Meningococcal vaccine. Adults with asplenia or persistent complement component deficiencies should receive 2 doses of quadrivalent meningococcal conjugate (MenACWY-D) vaccine. The doses should be obtained   at least 2 months apart. Microbiologists working with certain meningococcal bacteria, [place] recruits, people at risk during an outbreak, and people who travel to or live in countries with a high rate of meningitis should be immunized. A first-year college student up through age 34 years who is living in a residence hall should receive a dose if she did not receive a dose on or after her 16th birthday. Adults who have certain high-risk conditions should receive one or more doses of vaccine.  Hepatitis A vaccine. Adults who wish to be protected from this disease, have certain high-risk conditions, work with hepatitis A-infected animals, work in hepatitis A research labs, or travel to or work in countries with a high rate of hepatitis A should be immunized. Adults who were previously unvaccinated and who anticipate close contact with an international adoptee during the first 60 days after arrival in the [place] from a country with a high rate of hepatitis A should be immunized.  Hepatitis B vaccine. Adults who wish to be protected from this disease, have certain high-risk conditions, may be exposed to blood or other infectious body fluids, are household contacts or sex partners of hepatitis B positive people, are clients or workers in certain care facilities, or travel to or work in countries with a high rate of hepatitis B should be immunized.  Haemophilus influenzae type b (Hib) vaccine. A previously unvaccinated person with asplenia or sickle cell disease or having a scheduled splenectomy should receive 1 dose of Hib vaccine. Regardless of previous immunization, a recipient of a hematopoietic stem cell transplant should receive a  3-dose series 6-12 months after her successful transplant. Hib vaccine is not recommended for adults with HIV infection. Preventive Services / Frequency Ages 35 to 4 years  Blood pressure check.** / Every 3-5 years.  Lipid and cholesterol check.** / Every 5 years beginning at age 60.  Clinical breast exam.** / Every 3 years for women in their 71s and 10s.  BRCA-related cancer risk assessment.** / For women who have family members with a BRCA-related cancer (breast, ovarian, tubal, or peritoneal cancers).  Pap test.** / Every 2 years from ages 76 through 26. Every 3 years starting at age 61 through age 76 or 93 with a history of 3 consecutive normal Pap tests.  HPV screening.** / Every 3 years from ages 37 through ages 60 to 51 with a history of 3 consecutive normal Pap tests.  Hepatitis C blood test.** / For any individual with known risks for hepatitis C.  Skin self-exam. / Monthly.  Influenza vaccine. / Every year.  Tetanus, diphtheria, and acellular pertussis (Tdap, Td) vaccine.** / Consult your health care provider. Pregnant women should receive 1 dose of Tdap vaccine during each pregnancy. 1 dose of Td every 10 years.  Varicella vaccine.** / Consult your health care provider. Pregnant females who do not have evidence of immunity should receive the first dose after pregnancy.  HPV vaccine. / 3 doses over 6 months, if 93 and younger. The vaccine is not recommended for use in pregnant females. However, pregnancy testing is not needed before receiving a dose.  Measles, mumps, rubella (MMR) vaccine.** / You need at least 1 dose of MMR if you were born in 1957 or later. You may also need a 2nd dose. For females of childbearing age, rubella immunity should be determined. If there is no evidence of immunity, females who are not pregnant should be vaccinated. If there is no evidence of immunity, females who are  pregnant should delay immunization until after pregnancy.  Pneumococcal  13-valent conjugate (PCV13) vaccine.** / Consult your health care provider.  Pneumococcal polysaccharide (PPSV23) vaccine.** / 1 to 2 doses if you smoke cigarettes or if you have certain conditions.  Meningococcal vaccine.** / 1 dose if you are age 68 to 8 years and a Market researcher living in a residence hall, or have one of several medical conditions, you need to get vaccinated against meningococcal disease. You may also need additional booster doses.  Hepatitis A vaccine.** / Consult your health care provider.  Hepatitis B vaccine.** / Consult your health care provider.  Haemophilus influenzae type b (Hib) vaccine.** / Consult your health care provider. Ages 7 to 53 years  Blood pressure check.** / Every year.  Lipid and cholesterol check.** / Every 5 years beginning at age 25 years.  Lung cancer screening. / Every year if you are aged 11-80 years and have a 30-pack-year history of smoking and currently smoke or have quit within the past 15 years. Yearly screening is stopped once you have quit smoking for at least 15 years or develop a health problem that would prevent you from having lung cancer treatment.  Clinical breast exam.** / Every year after age 48 years.  BRCA-related cancer risk assessment.** / For women who have family members with a BRCA-related cancer (breast, ovarian, tubal, or peritoneal cancers).  Mammogram.** / Every year beginning at age 41 years and continuing for as long as you are in good health. Consult with your health care provider.  Pap test.** / Every 3 years starting at age 65 years through age 37 or 70 years with a history of 3 consecutive normal Pap tests.  HPV screening.** / Every 3 years from ages 72 years through ages 60 to 40 years with a history of 3 consecutive normal Pap tests.  Fecal occult blood test (FOBT) of stool. / Every year beginning at age 21 years and continuing until age 5 years. You may not need to do this test if you get  a colonoscopy every 10 years.  Flexible sigmoidoscopy or colonoscopy.** / Every 5 years for a flexible sigmoidoscopy or every 10 years for a colonoscopy beginning at age 35 years and continuing until age 48 years.  Hepatitis C blood test.** / For all people born from [birth year] through 1965 and any individual with known risks for hepatitis C.  Skin self-exam. / Monthly.  Influenza vaccine. / Every year.  Tetanus, diphtheria, and acellular pertussis (Tdap/Td) vaccine.** / Consult your health care provider. Pregnant women should receive 1 dose of Tdap vaccine during each pregnancy. 1 dose of Td every 10 years.  Varicella vaccine.** / Consult your health care provider. Pregnant females who do not have evidence of immunity should receive the first dose after pregnancy.  Zoster vaccine.** / 1 dose for adults aged 30 years or older.  Measles, mumps, rubella (MMR) vaccine.** / You need at least 1 dose of MMR if you were born in 1957 or later. You may also need a second dose. For females of childbearing age, rubella immunity should be determined. If there is no evidence of immunity, females who are not pregnant should be vaccinated. If there is no evidence of immunity, females who are pregnant should delay immunization until after pregnancy.  Pneumococcal 13-valent conjugate (PCV13) vaccine.** / Consult your health care provider.  Pneumococcal polysaccharide (PPSV23) vaccine.** / 1 to 2 doses if you smoke cigarettes or if you have certain conditions.  Meningococcal vaccine.** /  Consult your health care provider.  Hepatitis A vaccine.** / Consult your health care provider.  Hepatitis B vaccine.** / Consult your health care provider.  Haemophilus influenzae type b (Hib) vaccine.** / Consult your health care provider. Ages 64 years and over  Blood pressure check.** / Every year.  Lipid and cholesterol check.** / Every 5 years beginning at age 23 years.  Lung cancer screening. / Every year if you  are aged 16-80 years and have a 30-pack-year history of smoking and currently smoke or have quit within the past 15 years. Yearly screening is stopped once you have quit smoking for at least 15 years or develop a health problem that would prevent you from having lung cancer treatment.  Clinical breast exam.** / Every year after age 74 years.  BRCA-related cancer risk assessment.** / For women who have family members with a BRCA-related cancer (breast, ovarian, tubal, or peritoneal cancers).  Mammogram.** / Every year beginning at age 44 years and continuing for as long as you are in good health. Consult with your health care provider.  Pap test.** / Every 3 years starting at age 58 years through age 22 or 39 years with 3 consecutive normal Pap tests. Testing can be stopped between 65 and 70 years with 3 consecutive normal Pap tests and no abnormal Pap or HPV tests in the past 10 years.  HPV screening.** / Every 3 years from ages 64 years through ages 70 or 61 years with a history of 3 consecutive normal Pap tests. Testing can be stopped between 65 and 70 years with 3 consecutive normal Pap tests and no abnormal Pap or HPV tests in the past 10 years.  Fecal occult blood test (FOBT) of stool. / Every year beginning at age 40 years and continuing until age 27 years. You may not need to do this test if you get a colonoscopy every 10 years.  Flexible sigmoidoscopy or colonoscopy.** / Every 5 years for a flexible sigmoidoscopy or every 10 years for a colonoscopy beginning at age 7 years and continuing until age 32 years.  Hepatitis C blood test.** / For all people born from [birth year] through 1967 and any individual with known risks for hepatitis C.  Osteoporosis screening.** / A one-time screening for women ages 30 years and over and women at risk for fractures or osteoporosis.  Skin self-exam. / Monthly.  Influenza vaccine. / Every year.  Tetanus, diphtheria, and acellular pertussis (Tdap/Td)  vaccine.** / 1 dose of Td every 10 years.  Varicella vaccine.** / Consult your health care provider.  Zoster vaccine.** / 1 dose for adults aged 35 years or older.  Pneumococcal 13-valent conjugate (PCV13) vaccine.** / Consult your health care provider.  Pneumococcal polysaccharide (PPSV23) vaccine.** / 1 dose for all adults aged 46 years and older.  Meningococcal vaccine.** / Consult your health care provider.  Hepatitis A vaccine.** / Consult your health care provider.  Hepatitis B vaccine.** / Consult your health care provider.  Haemophilus influenzae type b (Hib) vaccine.** / Consult your health care provider. ** Family history and personal history of risk and conditions may change your health care provider's recommendations.   This information is not intended to replace advice given to you by your health care provider. Make sure you discuss any questions you have with your health care provider.   Document Released: 11/10/2001 Document Revised: 10/05/2014 Document Reviewed: 02/09/2011 Elsevier Interactive Patient Education [employer].

## 2016-05-22 NOTE — Progress Notes (Signed)
Pre visit review using our clinic review tool, if applicable. No additional management support is needed unless otherwise documented below in the visit note. 

## 2016-05-23 LAB — HEPATITIS C ANTIBODY: HCV AB: NEGATIVE

## 2016-07-06 ENCOUNTER — Other Ambulatory Visit: Payer: Self-pay | Admitting: Family Medicine

## 2016-07-06 DIAGNOSIS — R921 Mammographic calcification found on diagnostic imaging of breast: Secondary | ICD-10-CM

## 2016-07-07 DIAGNOSIS — K080 Exfoliation of teeth due to systemic causes: Secondary | ICD-10-CM | POA: Diagnosis not present

## 2016-07-15 ENCOUNTER — Ambulatory Visit
Admission: RE | Admit: 2016-07-15 | Discharge: 2016-07-15 | Disposition: A | Discharge status: Home / Self Care | Payer: Federal, State, Local not specified - PPO | Source: Ambulatory Visit | Attending: Family Medicine | Admitting: Family Medicine

## 2016-07-15 DIAGNOSIS — R921 Mammographic calcification found on diagnostic imaging of breast: Secondary | ICD-10-CM | POA: Diagnosis not present

## 2016-09-03 DIAGNOSIS — K080 Exfoliation of teeth due to systemic causes: Secondary | ICD-10-CM | POA: Diagnosis not present

## 2016-11-03 DIAGNOSIS — K080 Exfoliation of teeth due to systemic causes: Secondary | ICD-10-CM | POA: Diagnosis not present

## 2017-01-07 DIAGNOSIS — D225 Melanocytic nevi of trunk: Secondary | ICD-10-CM | POA: Diagnosis not present

## 2017-01-07 DIAGNOSIS — L814 Other melanin hyperpigmentation: Secondary | ICD-10-CM | POA: Diagnosis not present

## 2017-01-07 DIAGNOSIS — D1800 Hemangioma unspecified site: Secondary | ICD-10-CM | POA: Diagnosis not present

## 2017-01-07 DIAGNOSIS — L821 Other seborrheic keratosis: Secondary | ICD-10-CM | POA: Diagnosis not present

## 2017-01-20 DIAGNOSIS — H20013 Primary iridocyclitis, bilateral: Secondary | ICD-10-CM | POA: Diagnosis not present

## 2017-01-28 DIAGNOSIS — H20012 Primary iridocyclitis, left eye: Secondary | ICD-10-CM | POA: Diagnosis not present

## 2017-01-28 DIAGNOSIS — H5201 Hypermetropia, right eye: Secondary | ICD-10-CM | POA: Diagnosis not present

## 2017-02-11 DIAGNOSIS — K080 Exfoliation of teeth due to systemic causes: Secondary | ICD-10-CM | POA: Diagnosis not present

## 2017-02-18 DIAGNOSIS — N813 Complete uterovaginal prolapse: Secondary | ICD-10-CM | POA: Diagnosis not present

## 2017-02-18 DIAGNOSIS — N8111 Cystocele, midline: Secondary | ICD-10-CM | POA: Diagnosis not present

## 2017-02-18 DIAGNOSIS — Z1151 Encounter for screening for human papillomavirus (HPV): Secondary | ICD-10-CM | POA: Diagnosis not present

## 2017-02-18 DIAGNOSIS — Z124 Encounter for screening for malignant neoplasm of cervix: Secondary | ICD-10-CM | POA: Diagnosis not present

## 2017-05-03 ENCOUNTER — Telehealth: Payer: Self-pay | Admitting: Family Medicine

## 2017-05-03 NOTE — Telephone Encounter (Signed)
Relation to OV:ZCHY Call back number:[phone]   Reason for call:  Patient called to [facility] her physical for the same day as her daughter and patient original appointment was canceled for 05/28/17. Patient states she did not cancel and wanted to make PCP aware, advised patient we can place her on a high priority waiting list and if anyone cancels or re schedule before 06/07/17 we will call her.

## 2017-05-20 DIAGNOSIS — K080 Exfoliation of teeth due to systemic causes: Secondary | ICD-10-CM | POA: Diagnosis not present

## 2017-05-22 ENCOUNTER — Other Ambulatory Visit: Payer: Self-pay | Admitting: Family Medicine

## 2017-05-22 DIAGNOSIS — I10 Essential (primary) hypertension: Secondary | ICD-10-CM

## 2017-05-28 ENCOUNTER — Encounter: Payer: Federal, State, Local not specified - PPO | Admitting: Family Medicine

## 2017-06-07 ENCOUNTER — Ambulatory Visit (INDEPENDENT_AMBULATORY_CARE_PROVIDER_SITE_OTHER): Payer: Federal, State, Local not specified - PPO | Admitting: Family Medicine

## 2017-06-07 ENCOUNTER — Encounter: Payer: Self-pay | Admitting: Family Medicine

## 2017-06-07 VITALS — BP 112/68 | HR 62 | Temp 97.3°F | Ht 66.0 in | Wt 150.8 lb

## 2017-06-07 DIAGNOSIS — Z0000 Encounter for general adult medical examination without abnormal findings: Secondary | ICD-10-CM | POA: Diagnosis not present

## 2017-06-07 DIAGNOSIS — I10 Essential (primary) hypertension: Secondary | ICD-10-CM

## 2017-06-07 HISTORY — DX: Encounter for general adult medical examination without abnormal findings: Z00.00

## 2017-06-07 LAB — POC URINALSYSI DIPSTICK (AUTOMATED)
BILIRUBIN UA: NEGATIVE
GLUCOSE UA: NEGATIVE
KETONES UA: NEGATIVE
Leukocytes, UA: NEGATIVE
NITRITE UA: NEGATIVE
PH UA: 6 (ref 5.0–8.0)
Protein, UA: NEGATIVE
RBC UA: NEGATIVE
SPEC GRAV UA: 1.01 (ref 1.010–1.025)
Urobilinogen, UA: 0.2 E.U./dL

## 2017-06-07 MED ORDER — LISINOPRIL-HYDROCHLOROTHIAZIDE 10-12.5 MG PO TABS: 1.0000 | ORAL_TABLET | Freq: Every day | ORAL | 3 refills | Status: DC

## 2017-06-07 NOTE — Progress Notes (Signed)
Subjective:     Savannah Deleon is a 56 y.o. female and is here for a comprehensive physical exam. The patient reports no problems.  Social History   Social History  . Marital status: Single    Spouse name: N/A  . Number of children: N/A  . Years of education: N/A   Occupational History  . retired    Social History Main Topics  . Smoking status: Never Smoker  . Smokeless tobacco: Never Used  . Alcohol use Yes     Comment: Seldom  . Drug use: No  . Sexual activity: Yes    Partners: Male   Other Topics Concern  . Not on file   Social History Narrative   Exercise--limited,  Just moved from [place]               Health Maintenance  Topic Date Due  . HIV Screening  01/03/1974  . INFLUENZA VACCINE  12/26/2017 (Originally 04/28/2017)  . MAMMOGRAM  07/15/2018  . COLONOSCOPY  05/17/2019  . PAP SMEAR  02/19/2020  . TETANUS/TDAP  02/11/2022  . Hepatitis C Screening  Completed    The following portions of the patient's history were reviewed and updated as appropriate:  She  has a past medical history of Allergy; Asthma; Cataract; Environmental allergies; and Hypertension. She  does not have any pertinent problems on file. She  has a past surgical history that includes Cataract extraction (Left) and Cystectomy (2013). Her family history includes Colon cancer in her father; Heart disease in her sister; Heart failure in her maternal grandfather; Hypertension in her mother; Ovarian cancer in her mother. She  reports that she has never smoked. She has never used smokeless tobacco. She reports that she drinks alcohol. She reports that she does not use drugs. She has a current medication list which includes the following prescription(s): lisinopril-hydrochlorothiazide, multiple vitamins-minerals, and fish oil. Current Outpatient Prescriptions on File Prior to Visit  Medication Sig Dispense Refill  . Multiple Vitamins-Minerals (MULTIVITAMIN PO) Take by mouth.    . Omega-3 Fatty Acids (FISH  OIL) 1200 MG CAPS Take by mouth.     No current facility-administered medications on file prior to visit.    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  Review of Systems Review of Systems  Constitutional: Negative for activity change, appetite change and fatigue.  HENT: Negative for hearing loss, congestion, tinnitus and ear discharge.  dentist q40m Eyes: Negative for visual disturbance (see optho q1y -- vision corrected to 20/20 with glasses).  Respiratory: Negative for cough, chest tightness and shortness of breath.   Cardiovascular: Negative for chest pain, palpitations and leg swelling.  Gastrointestinal: Negative for abdominal pain, diarrhea, constipation and abdominal distention.  Genitourinary: Negative for urgency, frequency, decreased urine volume and difficulty urinating.  Musculoskeletal: Negative for back pain, arthralgias and gait problem.  Skin: Negative for color change, pallor and rash.  Neurological: Negative for dizziness, light-headedness, numbness and headaches.  Hematological: Negative for adenopathy. Does not bruise/bleed easily.  Psychiatric/Behavioral: Negative for suicidal ideas, confusion, sleep disturbance, self-injury, dysphoric mood, decreased concentration and agitation.       Objective:    BP 112/68 (BP Location: Right Arm, Patient Position: Sitting, Cuff Size: Normal)   Pulse 62   Temp (!) 97.3 F (36.3 C) (Oral)   Ht 5\' 6"  (1.676 m)   Wt 150 lb 12.8 oz (68.4 kg)   LMP 06/28/2013   SpO2 98%   BMI 24.34 kg/m  General appearance: alert, cooperative, appears stated age and no  distress Head: Normocephalic, without obvious abnormality, atraumatic Eyes: conjunctivae/corneas clear. PERRL, EOM's intact. Fundi benign. Ears: normal TM's and external ear canals both ears Nose: Nares normal. Septum midline. Mucosa normal. No drainage or sinus tenderness. Throat: lips, mucosa, and tongue normal; teeth and gums normal Neck: no adenopathy, no carotid bruit, no JVD,  supple, symmetrical, trachea midline and thyroid not enlarged, symmetric, no tenderness/mass/nodules Back: symmetric, no curvature. ROM normal. No CVA tenderness. Lungs: clear to auscultation bilaterally Breasts: gyn Heart: regular rate and rhythm, S1, S2 normal, no murmur, click, rub or gallop Abdomen: soft, non-tender; bowel sounds normal; no masses,  no organomegaly Pelvic: deferred--gyn Extremities: extremities normal, atraumatic, no cyanosis or edema Pulses: 2+ and symmetric Skin: Skin color, texture, turgor normal. No rashes or lesions Lymph nodes: Cervical, supraclavicular, and axillary nodes normal. Neurologic: Alert and oriented X 3, normal strength and tone. Normal symmetric reflexes. Normal coordination and gait    Assessment:    Healthy female exam.      Plan:    ghm utd Check labs  See After Visit Summary for Counseling Recommendations    1. Preventative health care See above - Lipid panel - TSH - CBC with Differential/Platelet - Comprehensive metabolic panel - POCT Urinalysis Dipstick (Automated)  2. Essential hypertension Well controlled, no changes to meds. Encouraged heart healthy diet such as the DASH diet and exercise as tolerated.  - Lipid panel - TSH - CBC with Differential/Platelet - Comprehensive metabolic panel - POCT Urinalysis Dipstick (Automated)

## 2017-06-07 NOTE — Patient Instructions (Signed)
Preventive Care 40-64 Years, Female Preventive care refers to lifestyle choices and visits with your health care provider that can promote health and wellness. What does preventive care include?  A yearly physical exam. This is also called an annual well check.  Dental exams once or twice a year.  Routine eye exams. Ask your health care provider how often you should have your eyes checked.  Personal lifestyle choices, including: ? Daily care of your teeth and gums. ? Regular physical activity. ? Eating a healthy diet. ? Avoiding tobacco and drug use. ? Limiting alcohol use. ? Practicing safe sex. ? Taking low-dose aspirin daily starting at age 58. ? Taking vitamin and mineral supplements as recommended by your health care provider. What happens during an annual well check? The services and screenings done by your health care provider during your annual well check will depend on your age, overall health, lifestyle risk factors, and family history of disease. Counseling Your health care provider may ask you questions about your:  Alcohol use.  Tobacco use.  Drug use.  Emotional well-being.  Home and relationship well-being.  Sexual activity.  Eating habits.  Work and work Statistician.  Method of birth control.  Menstrual cycle.  Pregnancy history.  Screening You may have the following tests or measurements:  Height, weight, and BMI.  Blood pressure.  Lipid and cholesterol levels. These may be checked every 5 years, or more frequently if you are over 81 years old.  Skin check.  Lung cancer screening. You may have this screening every year starting at age 78 if you have a 30-pack-year history of smoking and currently smoke or have quit within the past 15 years.  Fecal occult blood test (FOBT) of the stool. You may have this test every year starting at age 65.  Flexible sigmoidoscopy or colonoscopy. You may have a sigmoidoscopy every 5 years or a colonoscopy  every 10 years starting at age 30.  Hepatitis C blood test.  Hepatitis B blood test.  Sexually transmitted disease (STD) testing.  Diabetes screening. This is done by checking your blood sugar (glucose) after you have not eaten for a while (fasting). You may have this done every 1-3 years.  Mammogram. This may be done every 1-2 years. Talk to your health care provider about when you should start having regular mammograms. This may depend on whether you have a family history of breast cancer.  BRCA-related cancer screening. This may be done if you have a family history of breast, ovarian, tubal, or peritoneal cancers.  Pelvic exam and Pap test. This may be done every 3 years starting at age 80. Starting at age 36, this may be done every 5 years if you have a Pap test in combination with an HPV test.  Bone density scan. This is done to screen for osteoporosis. You may have this scan if you are at high risk for osteoporosis.  Discuss your test results, treatment options, and if necessary, the need for more tests with your health care provider. Vaccines Your health care provider may recommend certain vaccines, such as:  Influenza vaccine. This is recommended every year.  Tetanus, diphtheria, and acellular pertussis (Tdap, Td) vaccine. You may need a Td booster every 10 years.  Varicella vaccine. You may need this if you have not been vaccinated.  Zoster vaccine. You may need this after age 5.  Measles, mumps, and rubella (MMR) vaccine. You may need at least one dose of MMR if you were born in  1959 or later. You may also need a second dose.  Pneumococcal 13-valent conjugate (PCV13) vaccine. You may need this if you have certain conditions and were not previously vaccinated.  Pneumococcal polysaccharide (PPSV23) vaccine. You may need one or two doses if you smoke cigarettes or if you have certain conditions.  Meningococcal vaccine. You may need this if you have certain  conditions.  Hepatitis A vaccine. You may need this if you have certain conditions or if you travel or work in places where you may be exposed to hepatitis A.  Hepatitis B vaccine. You may need this if you have certain conditions or if you travel or work in places where you may be exposed to hepatitis B.  Haemophilus influenzae type b (Hib) vaccine. You may need this if you have certain conditions.  Talk to your health care provider about which screenings and vaccines you need and how often you need them. This information is not intended to replace advice given to you by your health care provider. Make sure you discuss any questions you have with your health care provider. Document Released: 10/11/2015 Document Revised: 06/03/2016 Document Reviewed: 07/16/2015 Elsevier Interactive Patient Education  2017 [employer].

## 2017-06-07 NOTE — Assessment & Plan Note (Signed)
Well controlled, no changes to meds. Encouraged heart healthy diet such as the DASH diet and exercise as tolerated.  °

## 2017-06-07 NOTE — Assessment & Plan Note (Signed)
ghm utd Check labs See AVS 

## 2017-06-08 LAB — COMPREHENSIVE METABOLIC PANEL
ALT: 17 U/L (ref 0–35)
AST: 20 U/L (ref 0–37)
Albumin: 4.4 g/dL (ref 3.5–5.2)
Alkaline Phosphatase: 51 U/L (ref 39–117)
BUN: 11 mg/dL (ref 6–23)
CHLORIDE: 95 meq/L — AB (ref 96–112)
CO2: 30 meq/L (ref 19–32)
Calcium: 9.8 mg/dL (ref 8.4–10.5)
Creatinine, Ser: 0.72 mg/dL (ref 0.40–1.20)
GFR: 106.84 mL/min (ref >60.00)
GLUCOSE: 78 mg/dL (ref 70–99)
POTASSIUM: 3.5 meq/L (ref 3.5–5.1)
SODIUM: 134 meq/L — AB (ref 135–145)
Total Bilirubin: 0.8 mg/dL (ref 0.2–1.2)
Total Protein: 7.9 g/dL (ref 6.0–8.3)

## 2017-06-08 LAB — LIPID PANEL
CHOLESTEROL: 243 mg/dL — AB (ref 0–200)
HDL: 71.1 mg/dL (ref >39.00)
LDL Cholesterol: 147 mg/dL — ABNORMAL HIGH (ref 0–99)
NonHDL: 171.74
TRIGLYCERIDES: 125 mg/dL (ref 0.0–149.0)
Total CHOL/HDL Ratio: 3
VLDL: 25 mg/dL (ref 0.0–40.0)

## 2017-06-08 LAB — CBC WITH DIFFERENTIAL/PLATELET
BASOS PCT: 0.5 % (ref 0.0–3.0)
Basophils Absolute: 0 10*3/uL (ref 0.0–0.1)
EOS PCT: 3.8 % (ref 0.0–5.0)
Eosinophils Absolute: 0.3 10*3/uL (ref 0.0–0.7)
HCT: 43 % (ref 36.0–46.0)
Hemoglobin: 13.8 g/dL (ref 12.0–15.0)
LYMPHS PCT: 28.4 % (ref 12.0–46.0)
Lymphs Abs: 2.1 10*3/uL (ref 0.7–4.0)
MCHC: 32.1 g/dL (ref 30.0–36.0)
MCV: 85.3 fl (ref 78.0–100.0)
MONO ABS: 0.8 10*3/uL (ref 0.1–1.0)
Monocytes Relative: 11.1 % (ref 3.0–12.0)
NEUTROS PCT: 56.2 % (ref 43.0–77.0)
Neutro Abs: 4.1 10*3/uL (ref 1.4–7.7)
Platelets: 317 10*3/uL (ref 150.0–400.0)
RBC: 5.05 Mil/uL (ref 3.87–5.11)
RDW: 14.7 % (ref 11.5–15.5)
WBC: 7.3 10*3/uL (ref 4.0–10.5)

## 2017-06-08 LAB — TSH: TSH: 1.31 u[IU]/mL (ref 0.35–4.50)

## 2017-07-14 ENCOUNTER — Other Ambulatory Visit: Payer: Self-pay | Admitting: Family Medicine

## 2017-07-14 DIAGNOSIS — Z1231 Encounter for screening mammogram for malignant neoplasm of breast: Secondary | ICD-10-CM

## 2017-08-09 ENCOUNTER — Ambulatory Visit: Payer: Federal, State, Local not specified - PPO

## 2017-08-23 ENCOUNTER — Other Ambulatory Visit: Payer: Self-pay | Admitting: Family Medicine

## 2017-08-23 DIAGNOSIS — I10 Essential (primary) hypertension: Secondary | ICD-10-CM

## 2017-08-30 ENCOUNTER — Ambulatory Visit: Payer: Federal, State, Local not specified - PPO

## 2017-08-31 DIAGNOSIS — K080 Exfoliation of teeth due to systemic causes: Secondary | ICD-10-CM | POA: Diagnosis not present

## 2017-09-03 ENCOUNTER — Ambulatory Visit
Admission: RE | Admit: 2017-09-03 | Discharge: 2017-09-03 | Disposition: A | Discharge status: Home / Self Care | Payer: Federal, State, Local not specified - PPO | Source: Ambulatory Visit | Attending: Family Medicine | Admitting: Family Medicine

## 2017-09-03 DIAGNOSIS — R922 Inconclusive mammogram: Secondary | ICD-10-CM | POA: Diagnosis not present

## 2017-09-03 DIAGNOSIS — Z1231 Encounter for screening mammogram for malignant neoplasm of breast: Secondary | ICD-10-CM

## 2017-11-16 DIAGNOSIS — K080 Exfoliation of teeth due to systemic causes: Secondary | ICD-10-CM | POA: Diagnosis not present

## 2018-01-13 ENCOUNTER — Telehealth: Payer: Self-pay | Admitting: *Deleted

## 2018-01-13 NOTE — Telephone Encounter (Signed)
Received Medical/Surgical Clearance Request from C. Jeannie Done, DDS, MS, PA/Periodontics; forwarded to provider/SLS 04/18

## 2018-03-10 DIAGNOSIS — K080 Exfoliation of teeth due to systemic causes: Secondary | ICD-10-CM | POA: Diagnosis not present

## 2018-04-14 DIAGNOSIS — K080 Exfoliation of teeth due to systemic causes: Secondary | ICD-10-CM | POA: Diagnosis not present

## 2018-06-09 ENCOUNTER — Encounter: Payer: Self-pay | Admitting: Family Medicine

## 2018-06-09 ENCOUNTER — Ambulatory Visit (INDEPENDENT_AMBULATORY_CARE_PROVIDER_SITE_OTHER): Payer: Federal, State, Local not specified - PPO | Admitting: Family Medicine

## 2018-06-09 VITALS — BP 124/82 | HR 85 | Temp 98.3°F | Resp 16 | Ht 66.0 in | Wt 158.4 lb

## 2018-06-09 DIAGNOSIS — Z23 Encounter for immunization: Secondary | ICD-10-CM

## 2018-06-09 DIAGNOSIS — I10 Essential (primary) hypertension: Secondary | ICD-10-CM | POA: Diagnosis not present

## 2018-06-09 DIAGNOSIS — Z0000 Encounter for general adult medical examination without abnormal findings: Secondary | ICD-10-CM

## 2018-06-09 LAB — CBC WITH DIFFERENTIAL/PLATELET
BASOS PCT: 0.4 % (ref 0.0–3.0)
Basophils Absolute: 0 10*3/uL (ref 0.0–0.1)
EOS ABS: 0.2 10*3/uL (ref 0.0–0.7)
Eosinophils Relative: 2.9 % (ref 0.0–5.0)
HCT: 42.3 % (ref 36.0–46.0)
HEMOGLOBIN: 13.9 g/dL (ref 12.0–15.0)
Lymphocytes Relative: 35.4 % (ref 12.0–46.0)
Lymphs Abs: 2 10*3/uL (ref 0.7–4.0)
MCHC: 32.7 g/dL (ref 30.0–36.0)
MCV: 84.3 fl (ref 78.0–100.0)
MONO ABS: 0.8 10*3/uL (ref 0.1–1.0)
Monocytes Relative: 13.6 % — ABNORMAL HIGH (ref 3.0–12.0)
Neutro Abs: 2.6 10*3/uL (ref 1.4–7.7)
Neutrophils Relative %: 47.7 % (ref 43.0–77.0)
Platelets: 309 10*3/uL (ref 150.0–400.0)
RBC: 5.02 Mil/uL (ref 3.87–5.11)
RDW: 15.1 % (ref 11.5–15.5)
WBC: 5.5 10*3/uL (ref 4.0–10.5)

## 2018-06-09 LAB — COMPREHENSIVE METABOLIC PANEL
ALBUMIN: 4.4 g/dL (ref 3.5–5.2)
ALK PHOS: 58 U/L (ref 39–117)
ALT: 15 U/L (ref 0–35)
AST: 17 U/L (ref 0–37)
BUN: 12 mg/dL (ref 6–23)
CO2: 32 mEq/L (ref 19–32)
Calcium: 9.8 mg/dL (ref 8.4–10.5)
Chloride: 96 mEq/L (ref 96–112)
Creatinine, Ser: 0.8 mg/dL (ref 0.40–1.20)
GFR: 94.28 mL/min (ref >60.00)
GLUCOSE: 82 mg/dL (ref 70–99)
Potassium: 4.1 mEq/L (ref 3.5–5.1)
SODIUM: 134 meq/L — AB (ref 135–145)
Total Bilirubin: 1 mg/dL (ref 0.2–1.2)
Total Protein: 7.6 g/dL (ref 6.0–8.3)

## 2018-06-09 LAB — LIPID PANEL
Cholesterol: 247 mg/dL — ABNORMAL HIGH (ref 0–200)
HDL: 69.5 mg/dL (ref >39.00)
LDL CALC: 153 mg/dL — AB (ref 0–99)
NonHDL: 177.31
Total CHOL/HDL Ratio: 4
Triglycerides: 124 mg/dL (ref 0.0–149.0)
VLDL: 24.8 mg/dL (ref 0.0–40.0)

## 2018-06-09 LAB — TSH: TSH: 1.69 u[IU]/mL (ref 0.35–4.50)

## 2018-06-09 MED ORDER — LISINOPRIL-HYDROCHLOROTHIAZIDE 10-12.5 MG PO TABS: 1.0000 | ORAL_TABLET | Freq: Every day | ORAL | 3 refills | Status: DC

## 2018-06-09 NOTE — Patient Instructions (Signed)
Preventive Care 40-64 Years, Female Preventive care refers to lifestyle choices and visits with your health care provider that can promote health and wellness. What does preventive care include?  A yearly physical exam. This is also called an annual well check.  Dental exams once or twice a year.  Routine eye exams. Ask your health care provider how often you should have your eyes checked.  Personal lifestyle choices, including: ? Daily care of your teeth and gums. ? Regular physical activity. ? Eating a healthy diet. ? Avoiding tobacco and drug use. ? Limiting alcohol use. ? Practicing safe sex. ? Taking low-dose aspirin daily starting at age 58. ? Taking vitamin and mineral supplements as recommended by your health care provider. What happens during an annual well check? The services and screenings done by your health care provider during your annual well check will depend on your age, overall health, lifestyle risk factors, and family history of disease. Counseling Your health care provider may ask you questions about your:  Alcohol use.  Tobacco use.  Drug use.  Emotional well-being.  Home and relationship well-being.  Sexual activity.  Eating habits.  Work and work Statistician.  Method of birth control.  Menstrual cycle.  Pregnancy history.  Screening You may have the following tests or measurements:  Height, weight, and BMI.  Blood pressure.  Lipid and cholesterol levels. These may be checked every 5 years, or more frequently if you are over 81 years old.  Skin check.  Lung cancer screening. You may have this screening every year starting at age 78 if you have a 30-pack-year history of smoking and currently smoke or have quit within the past 15 years.  Fecal occult blood test (FOBT) of the stool. You may have this test every year starting at age 65.  Flexible sigmoidoscopy or colonoscopy. You may have a sigmoidoscopy every 5 years or a colonoscopy  every 10 years starting at age 30.  Hepatitis C blood test.  Hepatitis B blood test.  Sexually transmitted disease (STD) testing.  Diabetes screening. This is done by checking your blood sugar (glucose) after you have not eaten for a while (fasting). You may have this done every 1-3 years.  Mammogram. This may be done every 1-2 years. Talk to your health care provider about when you should start having regular mammograms. This may depend on whether you have a family history of breast cancer.  BRCA-related cancer screening. This may be done if you have a family history of breast, ovarian, tubal, or peritoneal cancers.  Pelvic exam and Pap test. This may be done every 3 years starting at age 80. Starting at age 36, this may be done every 5 years if you have a Pap test in combination with an HPV test.  Bone density scan. This is done to screen for osteoporosis. You may have this scan if you are at high risk for osteoporosis.  Discuss your test results, treatment options, and if necessary, the need for more tests with your health care provider. Vaccines Your health care provider may recommend certain vaccines, such as:  Influenza vaccine. This is recommended every year.  Tetanus, diphtheria, and acellular pertussis (Tdap, Td) vaccine. You may need a Td booster every 10 years.  Varicella vaccine. You may need this if you have not been vaccinated.  Zoster vaccine. You may need this after age 5.  Measles, mumps, and rubella (MMR) vaccine. You may need at least one dose of MMR if you were born in  1959 or later. You may also need a second dose.  Pneumococcal 13-valent conjugate (PCV13) vaccine. You may need this if you have certain conditions and were not previously vaccinated.  Pneumococcal polysaccharide (PPSV23) vaccine. You may need one or two doses if you smoke cigarettes or if you have certain conditions.  Meningococcal vaccine. You may need this if you have certain  conditions.  Hepatitis A vaccine. You may need this if you have certain conditions or if you travel or work in places where you may be exposed to hepatitis A.  Hepatitis B vaccine. You may need this if you have certain conditions or if you travel or work in places where you may be exposed to hepatitis B.  Haemophilus influenzae type b (Hib) vaccine. You may need this if you have certain conditions.  Talk to your health care provider about which screenings and vaccines you need and how often you need them. This information is not intended to replace advice given to you by your health care provider. Make sure you discuss any questions you have with your health care provider. Document Released: 10/11/2015 Document Revised: 06/03/2016 Document Reviewed: 07/16/2015 Elsevier Interactive Patient Education  2018 [employer]  

## 2018-06-09 NOTE — Progress Notes (Signed)
Subjective:     Savannah Deleon is a 57 y.o. female and is here for a comprehensive physical exam. The patient reports no problems.  Social History   Socioeconomic History  . Marital status: Single    Spouse name: Not on file  . Number of children: Not on file  . Years of education: Not on file  . Highest education level: Not on file  Occupational History  . Occupation: retired  Scientific laboratory technician  . Financial resource strain: Not on file  . Food insecurity:    Worry: Not on file    Inability: Not on file  . Transportation needs:    Medical: Not on file    Non-medical: Not on file  Tobacco Use  . Smoking status: Never Smoker  . Smokeless tobacco: Never Used  Substance and Sexual Activity  . Alcohol use: Yes    Comment: Seldom  . Drug use: No  . Sexual activity: Yes    Partners: Male  Lifestyle  . Physical activity:    Days per week: Not on file    Minutes per session: Not on file  . Stress: Not on file  Relationships  . Social connections:    Talks on phone: Not on file    Gets together: Not on file    Attends religious service: Not on file    Active member of club or organization: Not on file    Attends meetings of clubs or organizations: Not on file    Relationship status: Not on file  . Intimate partner violence:    Fear of current or ex partner: Not on file    Emotionally abused: Not on file    Physically abused: Not on file    Forced sexual activity: Not on file  Other Topics Concern  . Not on file  Social History Narrative   Exercise--limited,  Just moved from [place]               Health Maintenance  Topic Date Due  . HIV Screening  01/03/1974  . INFLUENZA VACCINE  04/28/2018  . MAMMOGRAM  09/03/2018  . COLONOSCOPY  05/17/2019  . PAP SMEAR  02/19/2020  . TETANUS/TDAP  02/11/2022  . Hepatitis C Screening  Completed    The following portions of the patient's history were reviewed and updated as appropriate:  She  has a past medical history of Allergy,  Asthma, Cataract, Environmental allergies, and Hypertension. She does not have any pertinent problems on file. She  has a past surgical history that includes Cataract extraction (Left) and Cystectomy (2013). Her family history includes Colon cancer in her father; Heart disease in her sister; Heart failure in her maternal grandfather; Hypertension in her mother; Ovarian cancer in her mother. She  reports that she has never smoked. She has never used smokeless tobacco. She reports that she drinks alcohol. She reports that she does not use drugs. She has a current medication list which includes the following prescription(s): lisinopril-hydrochlorothiazide, multiple vitamins-minerals, and fish oil. Current Outpatient Medications on File Prior to Visit  Medication Sig Dispense Refill  . Multiple Vitamins-Minerals (MULTIVITAMIN PO) Take by mouth.    . Omega-3 Fatty Acids (FISH OIL) 1200 MG CAPS Take by mouth.     No current facility-administered medications on file prior to visit.    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  Review of Systems Review of Systems  Constitutional: Negative for activity change, appetite change and fatigue.  HENT: Negative for hearing loss, congestion, tinnitus and  ear discharge.  dentist q26m Eyes: Negative for visual disturbance (see optho q1y -- vision corrected to 20/20 with glasses).  Respiratory: Negative for cough, chest tightness and shortness of breath.   Cardiovascular: Negative for chest pain, palpitations and leg swelling.  Gastrointestinal: Negative for abdominal pain, diarrhea, constipation and abdominal distention.  Genitourinary: Negative for urgency, frequency, decreased urine volume and difficulty urinating.  Musculoskeletal: Negative for back pain, arthralgias and gait problem.  Skin: Negative for color change, pallor and rash.  Neurological: Negative for dizziness, light-headedness, numbness and headaches.  Hematological: Negative for adenopathy. Does not  bruise/bleed easily.  Psychiatric/Behavioral: Negative for suicidal ideas, confusion, sleep disturbance, self-injury, dysphoric mood, decreased concentration and agitation.        Objective:    BP 124/82 (BP Location: Right Arm, Patient Position: Sitting, Cuff Size: Small)   Pulse 85   Temp 98.3 F (36.8 C) (Oral)   Resp 16   Ht 5\' 6"  (1.676 m)   Wt 158 lb 6.4 oz (71.8 kg)   LMP 06/28/2013   SpO2 100%   BMI 25.57 kg/m  General appearance: alert, cooperative, appears stated age and no distress Head: Normocephalic, without obvious abnormality, atraumatic Eyes: conjunctivae/corneas clear. PERRL, EOM's intact. Fundi benign. Ears: normal TM's and external ear canals both ears Nose: Nares normal. Septum midline. Mucosa normal. No drainage or sinus tenderness. Throat: lips, mucosa, and tongue normal; teeth and gums normal Neck: no adenopathy, no carotid bruit, no JVD, supple, symmetrical, trachea midline and thyroid not enlarged, symmetric, no tenderness/mass/nodules Back: symmetric, no curvature. ROM normal. No CVA tenderness. Lungs: clear to auscultation bilaterally Breasts: normal appearance, no masses or tenderness Heart: regular rate and rhythm, S1, S2 normal, no murmur, click, rub or gallop Abdomen: soft, non-tender; bowel sounds normal; no masses,  no organomegaly Pelvic: deferred Extremities: extremities normal, atraumatic, no cyanosis or edema Pulses: 2+ and symmetric Skin: Skin color, texture, turgor normal. No rashes or lesions Lymph nodes: Cervical, supraclavicular, and axillary nodes normal. Neurologic: Alert and oriented X 3, normal strength and tone. Normal symmetric reflexes. Normal coordination and gait    Assessment:    Healthy female exam.      Plan:    ghm utd Check labs  See After Visit Summary for Counseling Recommendations    1. Preventative health care See above - TSH - Lipid panel - CBC with Differential/Platelet - Comprehensive metabolic  panel  2. Essential hypertension Stable  Refill meds Well controlled, no changes to meds. Encouraged heart healthy diet such as the DASH diet and exercise as tolerated.  - TSH - Lipid panel - CBC with Differential/Platelet - Comprehensive metabolic panel - lisinopril-hydrochlorothiazide (PRINZIDE,ZESTORETIC) 10-12.5 MG tablet; Take 1 tablet by mouth daily.  Dispense: 90 tablet; Refill: 3

## 2018-06-09 NOTE — Addendum Note (Signed)
Addended by: Jiles Prows on: 06/09/2018 11:46 AM   Modules accepted: Orders

## 2018-07-26 ENCOUNTER — Other Ambulatory Visit: Payer: Self-pay | Admitting: Family Medicine

## 2018-07-26 DIAGNOSIS — Z1231 Encounter for screening mammogram for malignant neoplasm of breast: Secondary | ICD-10-CM

## 2018-07-28 DIAGNOSIS — K080 Exfoliation of teeth due to systemic causes: Secondary | ICD-10-CM | POA: Diagnosis not present

## 2018-08-12 ENCOUNTER — Ambulatory Visit (INDEPENDENT_AMBULATORY_CARE_PROVIDER_SITE_OTHER): Payer: Federal, State, Local not specified - PPO

## 2018-08-12 DIAGNOSIS — Z23 Encounter for immunization: Secondary | ICD-10-CM | POA: Diagnosis not present

## 2018-08-15 ENCOUNTER — Other Ambulatory Visit: Payer: Self-pay | Admitting: Family Medicine

## 2018-08-15 DIAGNOSIS — I10 Essential (primary) hypertension: Secondary | ICD-10-CM

## 2018-09-08 ENCOUNTER — Ambulatory Visit
Admission: RE | Admit: 2018-09-08 | Discharge: 2018-09-08 | Disposition: A | Discharge status: Home / Self Care | Payer: Federal, State, Local not specified - PPO | Source: Ambulatory Visit | Attending: Family Medicine | Admitting: Family Medicine

## 2018-09-08 DIAGNOSIS — Z1231 Encounter for screening mammogram for malignant neoplasm of breast: Secondary | ICD-10-CM

## 2018-09-09 ENCOUNTER — Other Ambulatory Visit: Payer: Self-pay | Admitting: Family Medicine

## 2018-09-09 DIAGNOSIS — R928 Other abnormal and inconclusive findings on diagnostic imaging of breast: Secondary | ICD-10-CM

## 2018-09-14 ENCOUNTER — Ambulatory Visit
Admission: RE | Admit: 2018-09-14 | Discharge: 2018-09-14 | Disposition: A | Discharge status: Home / Self Care | Payer: Federal, State, Local not specified - PPO | Source: Ambulatory Visit | Attending: Family Medicine | Admitting: Family Medicine

## 2018-09-14 DIAGNOSIS — R922 Inconclusive mammogram: Secondary | ICD-10-CM | POA: Diagnosis not present

## 2018-09-14 DIAGNOSIS — N6489 Other specified disorders of breast: Secondary | ICD-10-CM | POA: Diagnosis not present

## 2018-09-14 DIAGNOSIS — R928 Other abnormal and inconclusive findings on diagnostic imaging of breast: Secondary | ICD-10-CM

## 2018-11-03 DIAGNOSIS — K080 Exfoliation of teeth due to systemic causes: Secondary | ICD-10-CM | POA: Diagnosis not present

## 2018-12-08 ENCOUNTER — Ambulatory Visit: Payer: Federal, State, Local not specified - PPO | Admitting: Family Medicine

## 2018-12-23 ENCOUNTER — Ambulatory Visit: Payer: Federal, State, Local not specified - PPO | Admitting: Family Medicine

## 2019-01-24 ENCOUNTER — Ambulatory Visit: Payer: Federal, State, Local not specified - PPO | Admitting: Family Medicine

## 2019-04-29 ENCOUNTER — Encounter: Payer: Self-pay | Admitting: Internal Medicine

## 2019-06-06 ENCOUNTER — Encounter: Payer: Self-pay | Admitting: Internal Medicine

## 2019-06-13 ENCOUNTER — Other Ambulatory Visit: Payer: Self-pay

## 2019-06-13 ENCOUNTER — Ambulatory Visit (INDEPENDENT_AMBULATORY_CARE_PROVIDER_SITE_OTHER): Payer: Federal, State, Local not specified - PPO | Admitting: Family Medicine

## 2019-06-13 ENCOUNTER — Encounter: Payer: Self-pay | Admitting: Family Medicine

## 2019-06-13 VITALS — BP 120/70 | HR 94 | Temp 98.2°F | Resp 18 | Ht 66.0 in | Wt 154.2 lb

## 2019-06-13 DIAGNOSIS — I10 Essential (primary) hypertension: Secondary | ICD-10-CM

## 2019-06-13 DIAGNOSIS — Z23 Encounter for immunization: Secondary | ICD-10-CM | POA: Diagnosis not present

## 2019-06-13 LAB — CBC WITH DIFFERENTIAL/PLATELET
Basophils Absolute: 0 10*3/uL (ref 0.0–0.1)
Basophils Relative: 0.5 % (ref 0.0–3.0)
Eosinophils Absolute: 0.1 10*3/uL (ref 0.0–0.7)
Eosinophils Relative: 2.3 % (ref 0.0–5.0)
HCT: 43.2 % (ref 36.0–46.0)
Hemoglobin: 13.7 g/dL (ref 12.0–15.0)
Lymphocytes Relative: 23.7 % (ref 12.0–46.0)
Lymphs Abs: 1.4 10*3/uL (ref 0.7–4.0)
MCHC: 31.8 g/dL (ref 30.0–36.0)
MCV: 85.3 fl (ref 78.0–100.0)
Monocytes Absolute: 0.7 10*3/uL (ref 0.1–1.0)
Monocytes Relative: 12.7 % — ABNORMAL HIGH (ref 3.0–12.0)
Neutro Abs: 3.5 10*3/uL (ref 1.4–7.7)
Neutrophils Relative %: 60.8 % (ref 43.0–77.0)
Platelets: 317 10*3/uL (ref 150.0–400.0)
RBC: 5.06 Mil/uL (ref 3.87–5.11)
RDW: 14.8 % (ref 11.5–15.5)
WBC: 5.7 10*3/uL (ref 4.0–10.5)

## 2019-06-13 LAB — COMPREHENSIVE METABOLIC PANEL WITH GFR
ALT: 14 U/L (ref 0–35)
AST: 16 U/L (ref 0–37)
Albumin: 4.3 g/dL (ref 3.5–5.2)
Alkaline Phosphatase: 54 U/L (ref 39–117)
BUN: 9 mg/dL (ref 6–23)
CO2: 32 meq/L (ref 19–32)
Calcium: 9.9 mg/dL (ref 8.4–10.5)
Chloride: 98 meq/L (ref 96–112)
Creatinine, Ser: 0.69 mg/dL (ref 0.40–1.20)
GFR: 104.86 mL/min (ref >60.00)
Glucose, Bld: 82 mg/dL (ref 70–99)
Potassium: 3.9 meq/L (ref 3.5–5.1)
Sodium: 138 meq/L (ref 135–145)
Total Bilirubin: 0.7 mg/dL (ref 0.2–1.2)
Total Protein: 7.4 g/dL (ref 6.0–8.3)

## 2019-06-13 LAB — LIPID PANEL
Cholesterol: 237 mg/dL — ABNORMAL HIGH (ref 0–200)
HDL: 65 mg/dL (ref >39.00)
LDL Cholesterol: 140 mg/dL — ABNORMAL HIGH (ref 0–99)
NonHDL: 171.73
Total CHOL/HDL Ratio: 4
Triglycerides: 161 mg/dL — ABNORMAL HIGH (ref 0.0–149.0)
VLDL: 32.2 mg/dL (ref 0.0–40.0)

## 2019-06-13 LAB — TSH: TSH: 1.75 u[IU]/mL (ref 0.35–4.50)

## 2019-06-13 MED ORDER — LISINOPRIL-HYDROCHLOROTHIAZIDE 10-12.5 MG PO TABS: 1.0000 | ORAL_TABLET | Freq: Every day | ORAL | 3 refills | Status: DC

## 2019-06-13 NOTE — Progress Notes (Signed)
Subjective:     Savannah Deleon is a 58 y.o. female and is here for a comprehensive physical exam. The patient reports no problems.  Social History   Socioeconomic History  . Marital status: Single    Spouse name: Not on file  . Number of children: Not on file  . Years of education: Not on file  . Highest education level: Not on file  Occupational History  . Occupation: retired  Scientific laboratory technician  . Financial resource strain: Not on file  . Food insecurity    Worry: Not on file    Inability: Not on file  . Transportation needs    Medical: Not on file    Non-medical: Not on file  Tobacco Use  . Smoking status: Never Smoker  . Smokeless tobacco: Never Used  Substance and Sexual Activity  . Alcohol use: Yes    Comment: Seldom  . Drug use: No  . Sexual activity: Yes    Partners: Male  Lifestyle  . Physical activity    Days per week: Not on file    Minutes per session: Not on file  . Stress: Not on file  Relationships  . Social Herbalist on phone: Not on file    Gets together: Not on file    Attends religious service: Not on file    Active member of club or organization: Not on file    Attends meetings of clubs or organizations: Not on file    Relationship status: Not on file  . Intimate partner violence    Fear of current or ex partner: Not on file    Emotionally abused: Not on file    Physically abused: Not on file    Forced sexual activity: Not on file  Other Topics Concern  . Not on file  Social History Narrative   Exercise--limited,  [place] moved from [place]               Health Maintenance  Topic Date Due  . HIV Screening  01/03/1974  . COLONOSCOPY  05/17/2019  . MAMMOGRAM  09/09/2019  . PAP SMEAR-Modifier  02/19/2020  . TETANUS/TDAP  02/11/2022  . INFLUENZA VACCINE  Completed  . Hepatitis C Screening  Completed    The following portions of the patient's history were reviewed and updated as appropriate: She  has a past medical history of Allergy,  Asthma, Cataract, Environmental allergies, and Hypertension. She does not have any pertinent problems on file. She  has a past surgical history that includes Cataract extraction (Left) and Cystectomy (2013). Her family history includes Colon cancer in her father; Heart disease in her sister; Heart failure in her maternal grandfather; Hypertension in her mother; Ovarian cancer in her mother. She  reports that she has never smoked. She has never used smokeless tobacco. She reports current alcohol use. She reports that she does not use drugs. She has a current medication list which includes the following prescription(s): lisinopril-hydrochlorothiazide, multiple vitamin, and fish oil. Current Outpatient Medications on File Prior to Visit  Medication Sig Dispense Refill  . Multiple Vitamins-Minerals (MULTIVITAMIN PO) Take by mouth.    . Omega-3 Fatty Acids (FISH OIL) 1200 MG CAPS Take by mouth.     No current facility-administered medications on file prior to visit.    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  Review of Systems Review of Systems  Constitutional: Negative for activity change, appetite change and fatigue.  HENT: Negative for hearing loss, congestion, tinnitus and ear discharge.  dentist q46m Eyes: Negative for visual disturbance (see optho q1y -- vision corrected to 20/20 with glasses).  Respiratory: Negative for cough, chest tightness and shortness of breath.   Cardiovascular: Negative for chest pain, palpitations and leg swelling.  Gastrointestinal: Negative for abdominal pain, diarrhea, constipation and abdominal distention.  Genitourinary: Negative for urgency, frequency, decreased urine volume and difficulty urinating.  Musculoskeletal: Negative for back pain, arthralgias and gait problem.  Skin: Negative for color change, pallor and rash.  Neurological: Negative for dizziness, light-headedness, numbness and headaches.  Hematological: Negative for adenopathy. Does not bruise/bleed easily.   Psychiatric/Behavioral: Negative for suicidal ideas, confusion, sleep disturbance, self-injury, dysphoric mood, decreased concentration and agitation.       Objective:    BP 120/70 (BP Location: Left Arm, Patient Position: Sitting, Cuff Size: Normal)   Pulse 94   Temp 98.2 F (36.8 C) (Temporal)   Resp 18   Ht 5\' 6"  (1.676 m)   Wt 154 lb 3.2 oz (69.9 kg)   LMP 06/28/2013   SpO2 98%   BMI 24.89 kg/m  General appearance: alert, cooperative, appears stated age and no distress Head: Normocephalic, without obvious abnormality, atraumatic Eyes: conjunctivae/corneas clear. PERRL, EOM's intact. Fundi benign. Ears: normal TM's and external ear canals both ears Nose: Nares normal. Septum midline. Mucosa normal. No drainage or sinus tenderness. Throat: lips, mucosa, and tongue normal; teeth and gums normal Neck: no adenopathy, no carotid bruit, no JVD, supple, symmetrical, trachea midline and thyroid not enlarged, symmetric, no tenderness/mass/nodules Back: symmetric, no curvature. ROM normal. No CVA tenderness. Lungs: clear to auscultation bilaterally Breasts: normal appearance, no masses or tenderness Heart: regular rate and rhythm, S1, S2 normal, no murmur, click, rub or gallop Abdomen: soft, non-tender; bowel sounds normal; no masses,  no organomegaly Pelvic: deferred Extremities: extremities normal, atraumatic, no cyanosis or edema Pulses: 2+ and symmetric Skin: Skin color, texture, turgor normal. No rashes or lesions Lymph nodes: Cervical, supraclavicular, and axillary nodes normal. Neurologic: Alert and oriented X 3, normal strength and tone. Normal symmetric reflexes. Normal coordination and gait    Assessment:    Healthy female exam.      Plan:     ghm utd Check labs See After Visit Summary for Counseling Recommendations    1. Need for influenza vaccination  - Flu Vaccine QUAD 6+ mos PF IM (Fluarix Quad PF)  2. Essential hypertension Well controlled, no changes to  meds. Encouraged heart healthy diet such as the DASH diet and exercise as tolerated.  - lisinopril-hydrochlorothiazide (ZESTORETIC) 10-12.5 MG tablet; Take 1 tablet by mouth daily.  Dispense: 90 tablet; Refill: 3 - Lipid panel - CBC with Differential/Platelet - Comprehensive metabolic panel - TSH

## 2019-06-13 NOTE — Patient Instructions (Signed)

## 2019-06-14 ENCOUNTER — Other Ambulatory Visit: Payer: Self-pay | Admitting: Family Medicine

## 2019-06-14 DIAGNOSIS — E785 Hyperlipidemia, unspecified: Secondary | ICD-10-CM

## 2019-07-10 ENCOUNTER — Other Ambulatory Visit: Payer: Self-pay

## 2019-07-10 ENCOUNTER — Ambulatory Visit: Payer: Federal, State, Local not specified - PPO | Admitting: *Deleted

## 2019-07-10 VITALS — Temp 96.8°F | Ht 66.0 in | Wt 154.0 lb

## 2019-07-10 DIAGNOSIS — Z86010 Personal history of colonic polyps: Secondary | ICD-10-CM

## 2019-07-10 DIAGNOSIS — Z800 Family history of malignant neoplasm of digestive organs: Secondary | ICD-10-CM

## 2019-07-10 MED ORDER — SUPREP BOWEL PREP KIT 17.5-3.13-1.6 GM/177ML PO SOLN: 1.0000 | Freq: Once | ORAL | 0 refills | Status: AC

## 2019-07-10 NOTE — Progress Notes (Signed)
No egg or soy allergy known to patient  No issues with past sedation with any surgeries  or procedures, no intubation problems  No diet pills per patient No home 02 use per patient  No blood thinners per patient  Pt denies issues with constipation  No A fib or A flutter  EMMI video information given  covid screening offered, patient will call back  suprep coupon given  Due to the COVID-19 pandemic we are asking patients to follow these guidelines. Please only bring one care partner. Please be aware that your care partner may wait in the car in the parking lot or if they feel like they will be too hot to wait in the car, they may wait in the lobby on the 4th floor. All care partners are required to wear a mask the entire time (we do not have any that we can provide them), they need to practice social distancing, and we will do a Covid check for all patient's and care partners when you arrive. Also we will check their temperature and your temperature. If the care partner waits in their car they need to stay in the parking lot the entire time and we will call them on their cell phone when the patient is ready for discharge so they can bring the car to the front of the building. Also all patient's will need to wear a mask into building.

## 2019-07-21 ENCOUNTER — Other Ambulatory Visit: Payer: Self-pay

## 2019-07-21 ENCOUNTER — Telehealth: Payer: Self-pay | Admitting: Internal Medicine

## 2019-07-21 DIAGNOSIS — Z86010 Personal history of colonic polyps: Secondary | ICD-10-CM

## 2019-07-21 NOTE — Telephone Encounter (Signed)
Pt will need covid testing done prior to procedure.See below for appt. Left message for pt to call back.  Due to recent COVID-19 restrictions implemented by [employer] and state authorities and in an effort to keep both patients and staff as safe as possible, [facility] requires COVID-19 testing prior to any scheduled endoscopic procedure. The testing center is located at [address] [place], [place] [address] in the [facility] [employer]  suite.  Your appointment has been scheduled for 08/02/19 at 11am.   Please bring your insurance cards to this appointment. You will require your COVID screen 2 business days prior to your endoscopic procedure.  You are not required to quarantine after your screening.  You will only receive a phone call with the results if it is POSITIVE.  If you do not receive a call the day before your procedure you should begin your prep, if ordered, and you should report to the endo center for your procedure at your designated appointment arrival time ( one hour prior to the procedure time). There is no cost to you for the screening on the day of the swab.  [facility] will file with your insurance company for the testing.    You may receive an automated phone call prior to your procedure or have a message in your MyChart that you have an appointment for a BP/15 at the [facility], please disregard this message.  Your testing will be at the [address] [place] location.   If you are leaving [facility] travel [place] on [address], turn left onto [address], turn night onto [address] at the 1st stop light turn right, pass the [employer] on your right and proceed to [place] (white building).

## 2019-07-21 NOTE — Telephone Encounter (Signed)
Pt called in to change her colonscopy date and changed it to 11/6. She stated that the pre visit nurse told her with her previous date she barely missed the deadline of them starting to do COVID test and she is wondering if she will need to get one now since she changed her date to November.

## 2019-07-24 ENCOUNTER — Encounter: Payer: Federal, State, Local not specified - PPO | Admitting: Internal Medicine

## 2019-07-24 NOTE — Telephone Encounter (Signed)
Pt wanted to reschedule her procedure to 12/9@10 :30am, covid test moved to 09/04/19@10 :30am. Pt aware of appts.

## 2019-07-24 NOTE — Telephone Encounter (Signed)
Left message for pt to call back. Date that was given to pt in December was incorrect. Left message for pt to call back to get colon rescheduled.

## 2019-07-25 NOTE — Telephone Encounter (Signed)
Left message for pt to call back  °

## 2019-07-26 NOTE — Telephone Encounter (Signed)
Pts colon now scheduled for 09/11/19@10 :30am, covid screen scheduled for 09/07/19@10 :30am. Pt aware of new appts and new instructions mailed to pt.

## 2019-08-04 ENCOUNTER — Encounter: Payer: Federal, State, Local not specified - PPO | Admitting: Internal Medicine

## 2019-08-09 ENCOUNTER — Other Ambulatory Visit: Payer: Self-pay | Admitting: Family Medicine

## 2019-08-09 DIAGNOSIS — Z1231 Encounter for screening mammogram for malignant neoplasm of breast: Secondary | ICD-10-CM

## 2019-09-04 DIAGNOSIS — L669 Cicatricial alopecia, unspecified: Secondary | ICD-10-CM | POA: Diagnosis not present

## 2019-09-04 DIAGNOSIS — L720 Epidermal cyst: Secondary | ICD-10-CM | POA: Diagnosis not present

## 2019-09-06 ENCOUNTER — Encounter: Payer: Federal, State, Local not specified - PPO | Admitting: Internal Medicine

## 2019-09-07 ENCOUNTER — Ambulatory Visit (INDEPENDENT_AMBULATORY_CARE_PROVIDER_SITE_OTHER): Payer: Federal, State, Local not specified - PPO

## 2019-09-07 ENCOUNTER — Other Ambulatory Visit: Payer: Self-pay

## 2019-09-07 DIAGNOSIS — Z1159 Encounter for screening for other viral diseases: Secondary | ICD-10-CM

## 2019-09-08 LAB — SARS CORONAVIRUS 2 (TAT 6-24 HRS): SARS Coronavirus 2: NEGATIVE

## 2019-09-11 ENCOUNTER — Other Ambulatory Visit: Payer: Self-pay

## 2019-09-11 ENCOUNTER — Ambulatory Visit (AMBULATORY_SURGERY_CENTER): Payer: Federal, State, Local not specified - PPO | Admitting: Internal Medicine

## 2019-09-11 ENCOUNTER — Encounter: Payer: Self-pay | Admitting: Internal Medicine

## 2019-09-11 VITALS — BP 129/82 | HR 76 | Temp 98.0°F | Resp 24 | Ht 66.0 in | Wt 154.0 lb

## 2019-09-11 DIAGNOSIS — D123 Benign neoplasm of transverse colon: Secondary | ICD-10-CM

## 2019-09-11 DIAGNOSIS — Z1211 Encounter for screening for malignant neoplasm of colon: Secondary | ICD-10-CM | POA: Diagnosis not present

## 2019-09-11 DIAGNOSIS — Z860100 Personal history of colon polyps, unspecified: Secondary | ICD-10-CM

## 2019-09-11 DIAGNOSIS — D124 Benign neoplasm of descending colon: Secondary | ICD-10-CM | POA: Diagnosis not present

## 2019-09-11 DIAGNOSIS — D120 Benign neoplasm of cecum: Secondary | ICD-10-CM

## 2019-09-11 MED ORDER — SODIUM CHLORIDE 0.9 % IV SOLN: 500.0000 mL | Freq: Once | INTRAVENOUS | Status: DC

## 2019-09-11 NOTE — Progress Notes (Signed)
Report to PACU, RN, vss, BBS= Clear.  

## 2019-09-11 NOTE — Progress Notes (Signed)
Called to room to assist during endoscopic procedure.  Patient ID and intended procedure confirmed with present staff. Received instructions for my participation in the procedure from the performing physician.  

## 2019-09-11 NOTE — Patient Instructions (Signed)
Please read handouts provided. Continue present medications. Await pathology results.        YOU HAD AN ENDOSCOPIC PROCEDURE TODAY AT THE [facility]:   Refer to the procedure report that was given to you for any specific questions about what was found during the examination.  If the procedure report does not answer your questions, please call your gastroenterologist to clarify.  If you requested that your care partner not be given the details of your procedure findings, then the procedure report has been included in a sealed envelope for you to review at your convenience later.  YOU SHOULD EXPECT: Some feelings of bloating in the abdomen. Passage of more gas than usual.  Walking can help get rid of the air that was put into your GI tract during the procedure and reduce the bloating. If you had a lower endoscopy (such as a colonoscopy or flexible sigmoidoscopy) you may notice spotting of blood in your stool or on the toilet paper. If you underwent a bowel prep for your procedure, you may not have a normal bowel movement for a few days.  Please Note:  You might notice some irritation and congestion in your nose or some drainage.  This is from the oxygen used during your procedure.  There is no need for concern and it should clear up in a day or so.  SYMPTOMS TO REPORT IMMEDIATELY:   Following lower endoscopy (colonoscopy or flexible sigmoidoscopy):  Excessive amounts of blood in the stool  Significant tenderness or worsening of abdominal pains  Swelling of the abdomen that is new, acute  Fever of 100F or higher    For urgent or emergent issues, a gastroenterologist can be reached at any hour by calling [phone].   DIET:  We do recommend a small meal at first, but then you may proceed to your regular diet.  Drink plenty of fluids but you should avoid alcoholic beverages for 24 hours.  ACTIVITY:  You should plan to take it easy for the rest of today and you should NOT  DRIVE or use heavy machinery until tomorrow (because of the sedation medicines used during the test).    FOLLOW UP: Our staff will call the number listed on your records 48-72 hours following your procedure to check on you and address any questions or concerns that you may have regarding the information given to you following your procedure. If we do not reach you, we will leave a message.  We will attempt to reach you two times.  During this call, we will ask if you have developed any symptoms of COVID 19. If you develop any symptoms (ie: fever, flu-like symptoms, shortness of breath, cough etc.) before then, please call [phone].  If you test positive for Covid 19 in the 2 weeks post procedure, please call and report this information to us.    If any biopsies were taken you will be contacted by phone or by letter within the next 1-3 weeks.  Please call us at [phone] if you have not heard about the biopsies in 3 weeks.    SIGNATURES/CONFIDENTIALITY: You and/or your care partner have signed paperwork which will be entered into your electronic medical record.  These signatures attest to the fact that that the information above on your After Visit Summary has been reviewed and is understood.  Full responsibility of the confidentiality of this discharge information lies with you and/or your care-partner. 

## 2019-09-11 NOTE — Op Note (Signed)
Savannah Deleon Patient Name: Savannah Deleon Procedure Date: 09/11/2019 9:47 AM MRN: [id number] Endoscopist: Jerene Bears , MD Age: 58 Referring MD:  Date of Birth: 10-21-1960 Gender: Female Account #: [id number] Procedure:                Colonoscopy Indications:              High risk colon cancer surveillance: Personal                            history of non-advanced adenoma, Family history of                            colon cancer in a first-degree relative before age                            30 years, Last colonoscopy 5 years ago Medicines:                Monitored Anesthesia Care Procedure:                Pre-Anesthesia Assessment:                           - Prior to the procedure, a History and Physical                            was performed, and patient medications and                            allergies were reviewed. The patient's tolerance of                            previous anesthesia was also reviewed. The risks                            and benefits of the procedure and the sedation                            options and risks were discussed with the patient.                            All questions were answered, and informed consent                            was obtained. Prior Anticoagulants: The patient has                            taken no previous anticoagulant or antiplatelet                            agents. ASA Grade Assessment: II - A patient with                            mild systemic disease. After reviewing the risks  and benefits, the patient was deemed in                            satisfactory condition to undergo the procedure.                           After obtaining informed consent, the colonoscope                            was passed under direct vision. Throughout the                            procedure, the patient's blood pressure, pulse, and                            oxygen saturations were  monitored continuously. The                            Colonoscope was introduced through the anus and                            advanced to the cecum, identified by appendiceal                            orifice and ileocecal valve. The colonoscopy was                            performed without difficulty. The patient tolerated                            the procedure well. The quality of the bowel                            preparation was good. The ileocecal valve,                            appendiceal orifice, and rectum were photographed. Scope In: 10:11:08 AM Scope Out: 10:33:50 AM Scope Withdrawal Time: 0 hours 19 minutes 52 seconds  Total Procedure Duration: 0 hours 22 minutes 42 seconds  Findings:                 The digital rectal exam was normal.                           Two sessile polyps were found in the cecum. The                            polyps were 3 to 4 mm in size. These polyps were                            removed with a cold snare. Resection and retrieval                            were complete.  A 1 mm polyp was found in the cecum. The polyp was                            sessile. The polyp was removed with a cold biopsy                            forceps. Resection and retrieval were complete.                           Four sessile polyps were found in the transverse                            colon. The polyps were 3 to 5 mm in size. These                            polyps were removed with a cold snare. Resection                            and retrieval were complete.                           A 4 mm polyp was found in the descending colon. The                            polyp was sessile. The polyp was removed with a                            cold snare. Resection and retrieval were complete.                           The exam was otherwise without abnormality on                            direct and retroflexion  views. Complications:            No immediate complications. Estimated Blood Loss:     Estimated blood loss was minimal. Impression:               - Two 3 to 4 mm polyps in the cecum, removed with a                            cold snare. Resected and retrieved.                           - One 1 mm polyp in the cecum, removed with a cold                            biopsy forceps. Resected and retrieved.                           - Four 3 to 5 mm polyps in the transverse colon,  removed with a cold snare. Resected and retrieved.                           - One 4 mm polyp in the descending colon, removed                            with a cold snare. Resected and retrieved.                           - The examination was otherwise normal on direct                            and retroflexion views. Recommendation:           - Patient has a contact number available for                            emergencies. The signs and symptoms of potential                            delayed complications were discussed with the                            patient. Return to normal activities tomorrow.                            Written discharge instructions were provided to the                            patient.                           - Resume previous diet.                           - Continue present medications.                           - Await pathology results.                           - Repeat colonoscopy is recommended for                            surveillance. The colonoscopy date will be                            determined after pathology results from today's                            exam become available for review. Jerene Bears, MD 09/11/2019 10:40:38 AM This report has been signed electronically.

## 2019-09-13 ENCOUNTER — Encounter: Payer: Self-pay | Admitting: Internal Medicine

## 2019-09-13 ENCOUNTER — Telehealth: Payer: Self-pay | Admitting: *Deleted

## 2019-09-13 NOTE — Telephone Encounter (Signed)
  Follow up Call-  Call back number 09/11/2019  Post procedure Call Back phone  # [phone], [phone]  Permission to leave phone message Yes  Some recent data might be hidden     Patient questions:  Do you have a fever, pain , or abdominal swelling? No. Pain Score  0 *  Have you tolerated food without any problems? Yes.    Have you been able to return to your normal activities? Yes.    Do you have any questions about your discharge instructions: Diet   No. Medications  No. Follow up visit  No.  Do you have questions or concerns about your Care? No.  Actions: * If pain score is 4 or above: 1. No action needed, pain <4.Have you developed a fever since your procedure? no  2.   Have you had an respiratory symptoms (SOB or cough) since your procedure? no  3.   Have you tested positive for COVID 19 since your procedure no  4.   Have you had any family members/close contacts diagnosed with the COVID 19 since your procedure?  no   If yes to any of these questions please route to Joylene John, RN and Alphonsa Gin, Therapist, sports.

## 2019-10-04 DIAGNOSIS — Z1151 Encounter for screening for human papillomavirus (HPV): Secondary | ICD-10-CM | POA: Diagnosis not present

## 2019-10-04 DIAGNOSIS — Z01419 Encounter for gynecological examination (general) (routine) without abnormal findings: Secondary | ICD-10-CM | POA: Diagnosis not present

## 2019-10-04 DIAGNOSIS — N813 Complete uterovaginal prolapse: Secondary | ICD-10-CM | POA: Diagnosis not present

## 2019-10-09 ENCOUNTER — Ambulatory Visit
Admission: RE | Admit: 2019-10-09 | Discharge: 2019-10-09 | Disposition: A | Discharge status: Home / Self Care | Payer: Federal, State, Local not specified - PPO | Source: Ambulatory Visit | Attending: Family Medicine | Admitting: Family Medicine

## 2019-10-09 ENCOUNTER — Other Ambulatory Visit: Payer: Self-pay

## 2019-10-09 DIAGNOSIS — Z1231 Encounter for screening mammogram for malignant neoplasm of breast: Secondary | ICD-10-CM

## 2019-10-10 ENCOUNTER — Other Ambulatory Visit: Payer: Self-pay | Admitting: Family Medicine

## 2019-10-10 DIAGNOSIS — R928 Other abnormal and inconclusive findings on diagnostic imaging of breast: Secondary | ICD-10-CM

## 2019-10-18 ENCOUNTER — Ambulatory Visit
Admission: RE | Admit: 2019-10-18 | Discharge: 2019-10-18 | Disposition: A | Discharge status: Home / Self Care | Payer: Federal, State, Local not specified - PPO | Source: Ambulatory Visit | Attending: Family Medicine | Admitting: Family Medicine

## 2019-10-18 ENCOUNTER — Other Ambulatory Visit: Payer: Self-pay | Admitting: Family Medicine

## 2019-10-18 ENCOUNTER — Other Ambulatory Visit: Payer: Self-pay

## 2019-10-18 DIAGNOSIS — R928 Other abnormal and inconclusive findings on diagnostic imaging of breast: Secondary | ICD-10-CM | POA: Diagnosis not present

## 2019-10-18 DIAGNOSIS — N6324 Unspecified lump in the left breast, lower inner quadrant: Secondary | ICD-10-CM | POA: Diagnosis not present

## 2019-12-12 ENCOUNTER — Ambulatory Visit: Payer: Federal, State, Local not specified - PPO | Admitting: Family Medicine

## 2020-04-03 ENCOUNTER — Other Ambulatory Visit: Payer: Self-pay | Admitting: Family Medicine

## 2020-04-03 ENCOUNTER — Ambulatory Visit
Admission: RE | Admit: 2020-04-03 | Discharge: 2020-04-03 | Disposition: A | Discharge status: Home / Self Care | Payer: Federal, State, Local not specified - PPO | Source: Ambulatory Visit | Attending: Family Medicine | Admitting: Family Medicine

## 2020-04-03 ENCOUNTER — Other Ambulatory Visit: Payer: Self-pay

## 2020-04-03 DIAGNOSIS — R928 Other abnormal and inconclusive findings on diagnostic imaging of breast: Secondary | ICD-10-CM

## 2020-04-03 DIAGNOSIS — N6489 Other specified disorders of breast: Secondary | ICD-10-CM

## 2020-04-03 DIAGNOSIS — N6002 Solitary cyst of left breast: Secondary | ICD-10-CM | POA: Diagnosis not present

## 2020-04-10 ENCOUNTER — Ambulatory Visit
Admission: RE | Admit: 2020-04-10 | Discharge: 2020-04-10 | Disposition: A | Discharge status: Home / Self Care | Payer: Federal, State, Local not specified - PPO | Source: Ambulatory Visit | Attending: Family Medicine | Admitting: Family Medicine

## 2020-04-10 ENCOUNTER — Other Ambulatory Visit: Payer: Self-pay

## 2020-04-10 DIAGNOSIS — R928 Other abnormal and inconclusive findings on diagnostic imaging of breast: Secondary | ICD-10-CM | POA: Diagnosis not present

## 2020-04-10 DIAGNOSIS — N6489 Other specified disorders of breast: Secondary | ICD-10-CM

## 2020-04-10 DIAGNOSIS — N642 Atrophy of breast: Secondary | ICD-10-CM | POA: Diagnosis not present

## 2020-04-10 HISTORY — PX: BREAST BIOPSY: SHX20

## 2020-06-18 ENCOUNTER — Other Ambulatory Visit: Payer: Self-pay

## 2020-06-18 ENCOUNTER — Ambulatory Visit (INDEPENDENT_AMBULATORY_CARE_PROVIDER_SITE_OTHER): Payer: Federal, State, Local not specified - PPO | Admitting: Family Medicine

## 2020-06-18 ENCOUNTER — Encounter: Payer: Self-pay | Admitting: Family Medicine

## 2020-06-18 VITALS — BP 130/60 | HR 81 | Temp 97.9°F | Resp 18 | Ht 66.0 in | Wt 150.8 lb

## 2020-06-18 DIAGNOSIS — I10 Essential (primary) hypertension: Secondary | ICD-10-CM

## 2020-06-18 DIAGNOSIS — Z0000 Encounter for general adult medical examination without abnormal findings: Secondary | ICD-10-CM | POA: Diagnosis not present

## 2020-06-18 DIAGNOSIS — Z23 Encounter for immunization: Secondary | ICD-10-CM | POA: Diagnosis not present

## 2020-06-18 DIAGNOSIS — E559 Vitamin D deficiency, unspecified: Secondary | ICD-10-CM

## 2020-06-18 MED ORDER — LISINOPRIL-HYDROCHLOROTHIAZIDE 10-12.5 MG PO TABS: 1.0000 | ORAL_TABLET | Freq: Every day | ORAL | 3 refills | Status: DC

## 2020-06-18 NOTE — Patient Instructions (Signed)

## 2020-06-18 NOTE — Progress Notes (Signed)
Subjective:     Savannah Deleon is a 59 y.o. female and is here for a comprehensive physical exam. The patient reports no problems. She also needs f/u htn  Social History   Socioeconomic History  . Marital status: Single    Spouse name: Not on file  . Number of children: Not on file  . Years of education: Not on file  . Highest education level: Not on file  Occupational History  . Occupation: retired  Tobacco Use  . Smoking status: Never Smoker  . Smokeless tobacco: Never Used  Vaping Use  . Vaping Use: Never used  Substance and Sexual Activity  . Alcohol use: Yes    Comment: Seldom  . Drug use: No  . Sexual activity: Yes    Partners: Male  Other Topics Concern  . Not on file  Social History Narrative   Exercise--limited,  Just moved from [place]               Social Determinants of Health   Financial Resource Strain:   . Difficulty of Paying Living Expenses: Not on file  Food Insecurity:   . Worried About Charity fundraiser in the Last Year: Not on file  . Ran Out of Food in the Last Year: Not on file  Transportation Needs:   . Lack of Transportation (Medical): Not on file  . Lack of Transportation (Non-Medical): Not on file  Physical Activity:   . Days of Exercise per Week: Not on file  . Minutes of Exercise per Session: Not on file  Stress:   . Feeling of Stress : Not on file  Social Connections:   . Frequency of Communication with Friends and Family: Not on file  . Frequency of Social Gatherings with Friends and Family: Not on file  . Attends Religious Services: Not on file  . Active Member of Clubs or Organizations: Not on file  . Attends Archivist Meetings: Not on file  . Marital Status: Not on file  Intimate Partner Violence:   . Fear of Current or Ex-Partner: Not on file  . Emotionally Abused: Not on file  . Physically Abused: Not on file  . Sexually Abused: Not on file   Health Maintenance  Topic Date Due  . HIV Screening  Never done   . INFLUENZA VACCINE  04/28/2020  . MAMMOGRAM  10/08/2020  . TETANUS/TDAP  02/11/2022  . COLONOSCOPY  09/10/2022  . PAP SMEAR-Modifier  12/28/2022  . Hepatitis C Screening  Completed    The following portions of the patient's history were reviewed and updated as appropriate:  She  has a past medical history of Allergy, Anemia, Asthma, Cataract, Environmental allergies, Heart murmur, and Hypertension. She does not have any pertinent problems on file. She  has a past surgical history that includes Cataract extraction (Left) and Cystectomy (2013). Her family history includes Colon cancer in her father and paternal grandmother; Heart disease in her sister; Heart failure in her maternal grandfather; Hypertension in her mother; Ovarian cancer in her mother. She  reports that she has never smoked. She has never used smokeless tobacco. She reports current alcohol use. She reports that she does not use drugs. She has a current medication list which includes the following prescription(s): apple cider vinegar, lisinopril-hydrochlorothiazide, multiple vitamin, fish oil, vitamin d, and vitamin e. Current Outpatient Medications on File Prior to Visit  Medication Sig Dispense Refill  . APPLE CIDER VINEGAR PO Take by mouth.    Marland Kitchen  Multiple Vitamins-Minerals (MULTIVITAMIN PO) Take by mouth.    . Omega-3 Fatty Acids (FISH OIL) 1200 MG CAPS Take by mouth.    Marland Kitchen VITAMIN D PO Take by mouth.    Marland Kitchen VITAMIN E PO Take by mouth.     No current facility-administered medications on file prior to visit.   She is allergic to sulfur, quinoa-kale-hemp [alimentum], and wheat bran..  Review of Systems Review of Systems  Constitutional: Negative for activity change, appetite change and fatigue.  HENT: Negative for hearing loss, congestion, tinnitus and ear discharge.  dentist q47m Eyes: Negative for visual disturbance (see optho q1y -- vision corrected to 20/20 with glasses).  Respiratory: Negative for cough, chest  tightness and shortness of breath.   Cardiovascular: Negative for chest pain, palpitations and leg swelling.  Gastrointestinal: Negative for abdominal pain, diarrhea, constipation and abdominal distention.  Genitourinary: Negative for urgency, frequency, decreased urine volume and difficulty urinating.  Musculoskeletal: Negative for back pain, arthralgias and gait problem.  Skin: Negative for color change, pallor and rash.  Neurological: Negative for dizziness, light-headedness, numbness and headaches.  Hematological: Negative for adenopathy. Does not bruise/bleed easily.  Psychiatric/Behavioral: Negative for suicidal ideas, confusion, sleep disturbance, self-injury, dysphoric mood, decreased concentration and agitation.       Objective:    BP 130/60 (BP Location: Right Arm, Patient Position: Sitting, Cuff Size: Normal)   Pulse 81   Temp 97.9 F (36.6 C) (Oral)   Resp 18   Ht 5\' 6"  (1.676 m)   Wt 150 lb 12.8 oz (68.4 kg)   LMP 06/28/2013   SpO2 97%   BMI 24.34 kg/m  General appearance: alert, cooperative, appears stated age and no distress Head: Normocephalic, without obvious abnormality, atraumatic Eyes: conjunctivae/corneas clear. PERRL, EOM's intact. Fundi benign. Ears: normal TM's and external ear canals both ears Neck: no adenopathy, no carotid bruit, no JVD, supple, symmetrical, trachea midline and thyroid not enlarged, symmetric, no tenderness/mass/nodules Back: symmetric, no curvature. ROM normal. No CVA tenderness. Lungs: clear to auscultation bilaterally Breasts: gyn Heart: regular rate and rhythm, S1, S2 normal, no murmur, click, rub or gallop Abdomen: soft, non-tender; bowel sounds normal; no masses,  no organomegaly Pelvic: deferred--gyn Extremities: extremities normal, atraumatic, no cyanosis or edema Pulses: 2+ and symmetric Skin: Skin color, texture, turgor normal. No rashes or lesions Lymph nodes: Cervical, supraclavicular, and axillary nodes  normal. Neurologic: Alert and oriented X 3, normal strength and tone. Normal symmetric reflexes. Normal coordination and gait    Assessment:    Healthy female exam.      Plan:    ghm utd Check labs  See After Visit Summary for Counseling Recommendations    1. Essential hypertension Well controlled, no changes to meds. Encouraged heart healthy diet such as the DASH diet and exercise as tolerated.  - lisinopril-hydrochlorothiazide (ZESTORETIC) 10-12.5 MG tablet; Take 1 tablet by mouth daily.  Dispense: 90 tablet; Refill: 3 - Lipid panel - Comprehensive metabolic panel  2. Vitamin D deficiency Check level  - Vitamin D (25 hydroxy)  3. Need for influenza vaccination   - Flu Vaccine QUAD 36+ mos IM  4. Preventative health care See above

## 2020-06-19 ENCOUNTER — Other Ambulatory Visit: Payer: Self-pay | Admitting: Family Medicine

## 2020-06-19 DIAGNOSIS — E785 Hyperlipidemia, unspecified: Secondary | ICD-10-CM

## 2020-06-19 LAB — COMPREHENSIVE METABOLIC PANEL
AG Ratio: 1.4 (calc) (ref 1.0–2.5)
ALT: 16 U/L (ref 6–29)
AST: 19 U/L (ref 10–35)
Albumin: 4.6 g/dL (ref 3.6–5.1)
Alkaline phosphatase (APISO): 54 U/L (ref 37–153)
BUN: 13 mg/dL (ref 7–25)
CO2: 30 mmol/L (ref 20–32)
Calcium: 10.5 mg/dL — ABNORMAL HIGH (ref 8.6–10.4)
Chloride: 98 mmol/L (ref 98–110)
Creat: 0.73 mg/dL (ref 0.50–0.99)
Globulin: 3.4 g/dL (calc) (ref 1.9–3.7)
Glucose, Bld: 83 mg/dL (ref 65–99)
Potassium: 4.5 mmol/L (ref 3.5–5.3)
Sodium: 136 mmol/L (ref 135–146)
Total Bilirubin: 0.8 mg/dL (ref 0.2–1.2)
Total Protein: 8 g/dL (ref 6.1–8.1)

## 2020-06-19 LAB — LIPID PANEL
Cholesterol: 266 mg/dL — ABNORMAL HIGH (ref <200)
HDL: 73 mg/dL (ref >=50)
LDL Cholesterol (Calc): 169 mg/dL (calc) — ABNORMAL HIGH
Non-HDL Cholesterol (Calc): 193 mg/dL (calc) — ABNORMAL HIGH (ref <130)
Total CHOL/HDL Ratio: 3.6 (calc) (ref <5.0)
Triglycerides: 109 mg/dL (ref <150)

## 2020-06-19 LAB — VITAMIN D 25 HYDROXY (VIT D DEFICIENCY, FRACTURES): Vit D, 25-Hydroxy: 41 ng/mL (ref 30–100)

## 2020-07-01 ENCOUNTER — Ambulatory Visit: Payer: Federal, State, Local not specified - PPO | Attending: Internal Medicine

## 2020-07-01 ENCOUNTER — Other Ambulatory Visit: Payer: Self-pay

## 2020-07-01 DIAGNOSIS — Z23 Encounter for immunization: Secondary | ICD-10-CM

## 2020-07-01 NOTE — Progress Notes (Signed)
   Covid-19 Vaccination Clinic  Name:  Taela Charbonneau    MRN: [id number] DOB: 27-Aug-1961  07/01/2020  Ms. Hanshaw was observed post Covid-19 immunization for 15 minutes without incident. She was provided with Vaccine Information Sheet and instruction to access the V-Safe system. Vaccinated by Harriet Pho.  Ms. Neuwirth was instructed to call 911 with any severe reactions post vaccine: Marland Kitchen Difficulty breathing  . Swelling of face and throat  . A fast heartbeat  . A bad rash all over body  . Dizziness and weakness   Immunizations Administered    Name Date Dose VIS Date [place] COVID-19 Vaccine 07/01/2020 11:29 AM 0.3 mL 11/22/2018 Intramuscular   Manufacturer: [place]   Lot: [id number]   [place]: [id number]

## 2020-07-08 ENCOUNTER — Other Ambulatory Visit (HOSPITAL_BASED_OUTPATIENT_CLINIC_OR_DEPARTMENT_OTHER): Payer: Self-pay | Admitting: Internal Medicine

## 2020-07-08 MED FILL — PFIZER-BIONTECH COVID-19 VA: 30 | 1 days supply | Qty: 0 | Fill #0

## 2020-07-12 IMAGING — MG DIGITAL SCREENING BILAT W/ TOMO W/ CAD
6 of 10 series · 6 of 30 positions shown · non-contrast
Comparison: Previous exam(s).

CLINICAL DATA: Screening.

EXAM:
DIGITAL SCREENING BILATERAL MAMMOGRAM WITH TOMO AND CAD

[L MLO synth-2D]
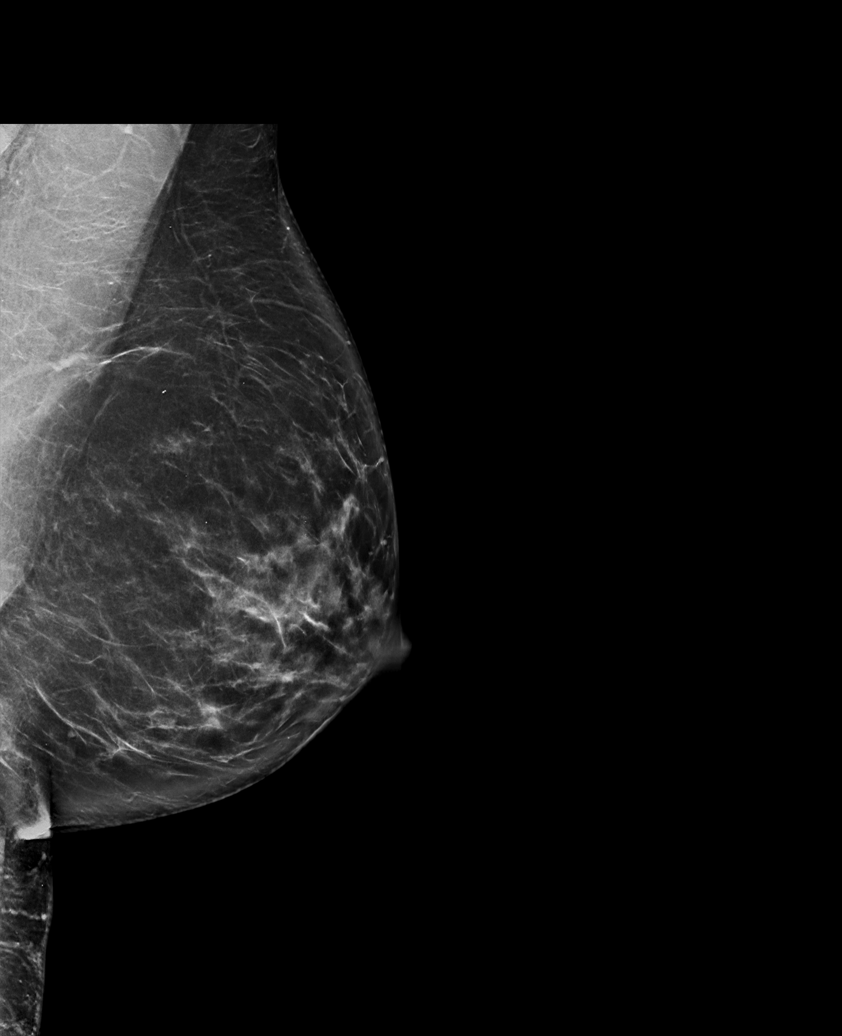

[R MLO synth-2D (1 of 2)]
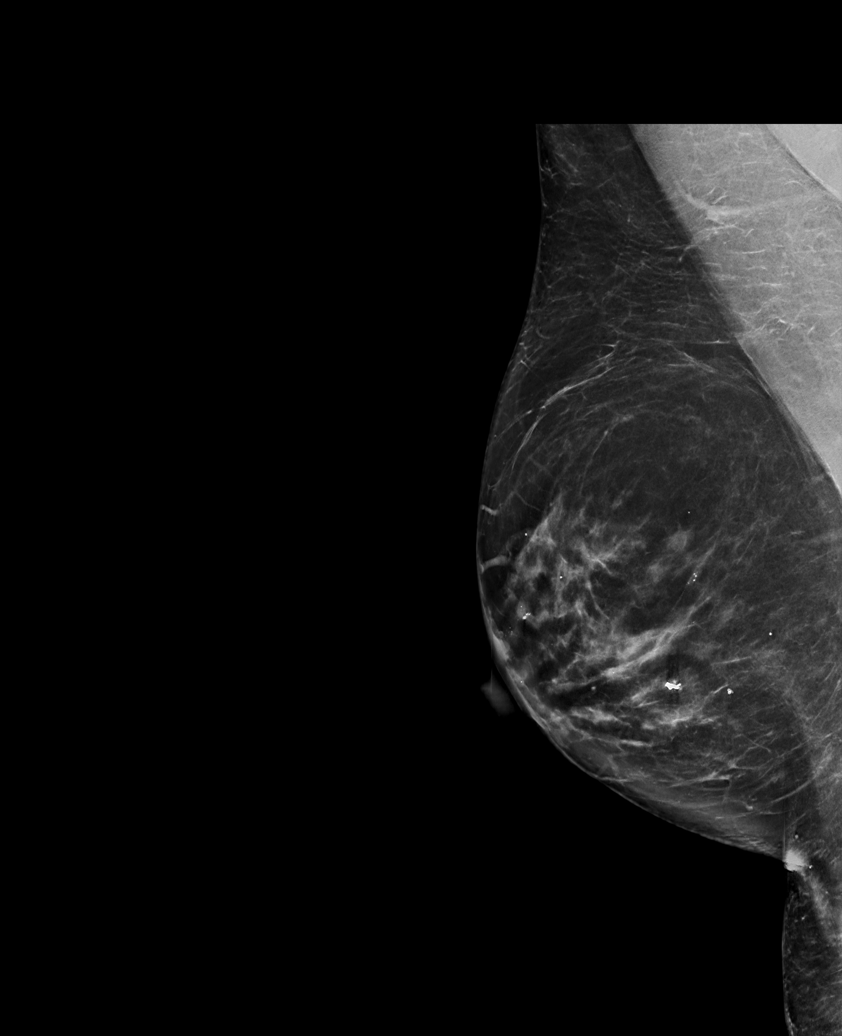

[R CC synth-2D]
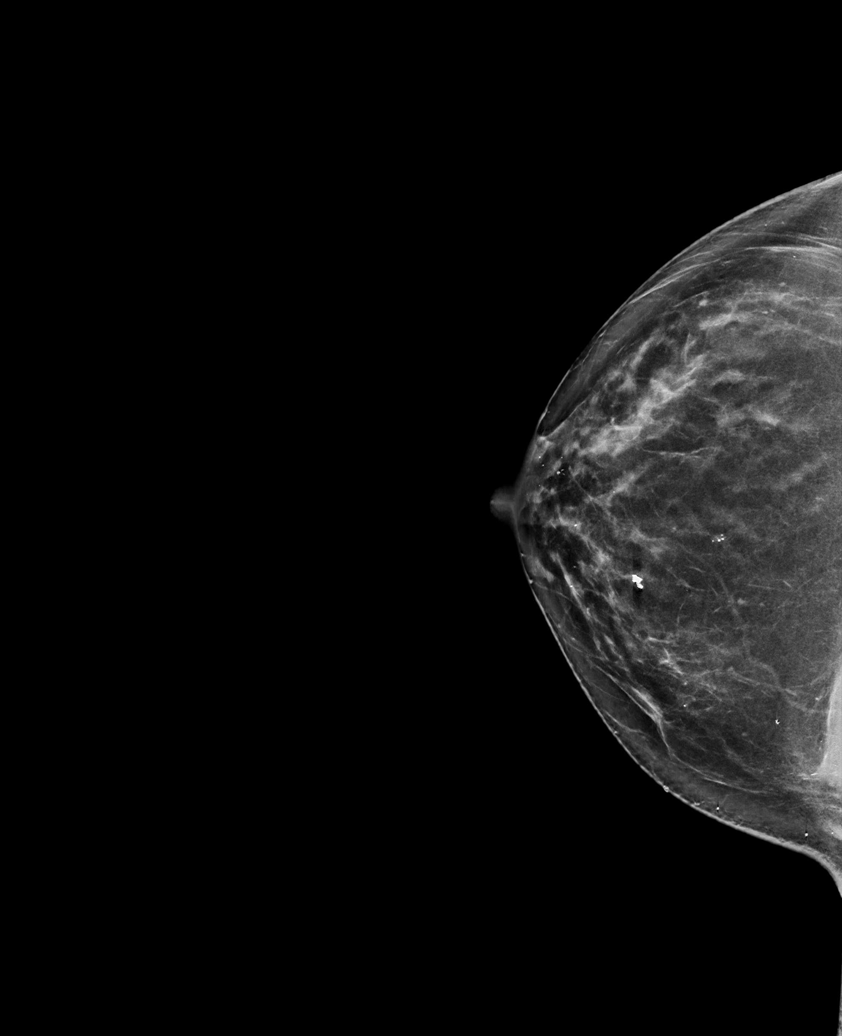

[L CC synth-2D]
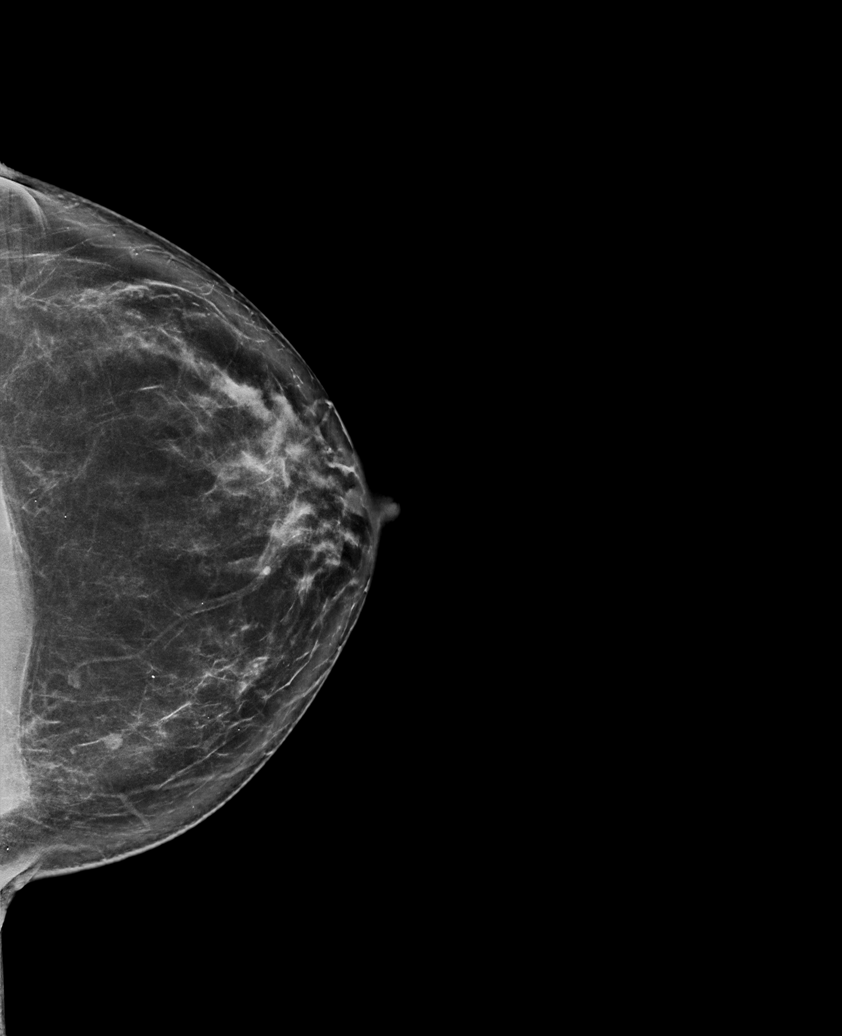

[R MLO synth-2D (2 of 2)]
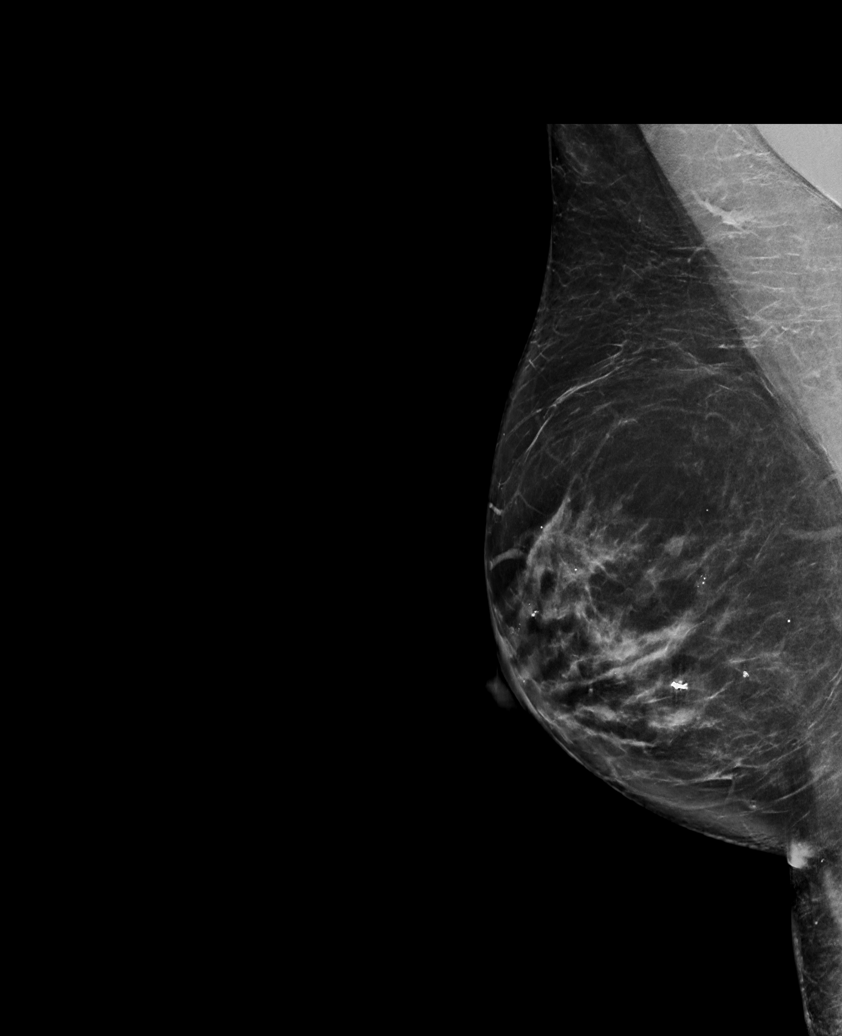

[R MLO tomo · tomo slice 44/87.0]
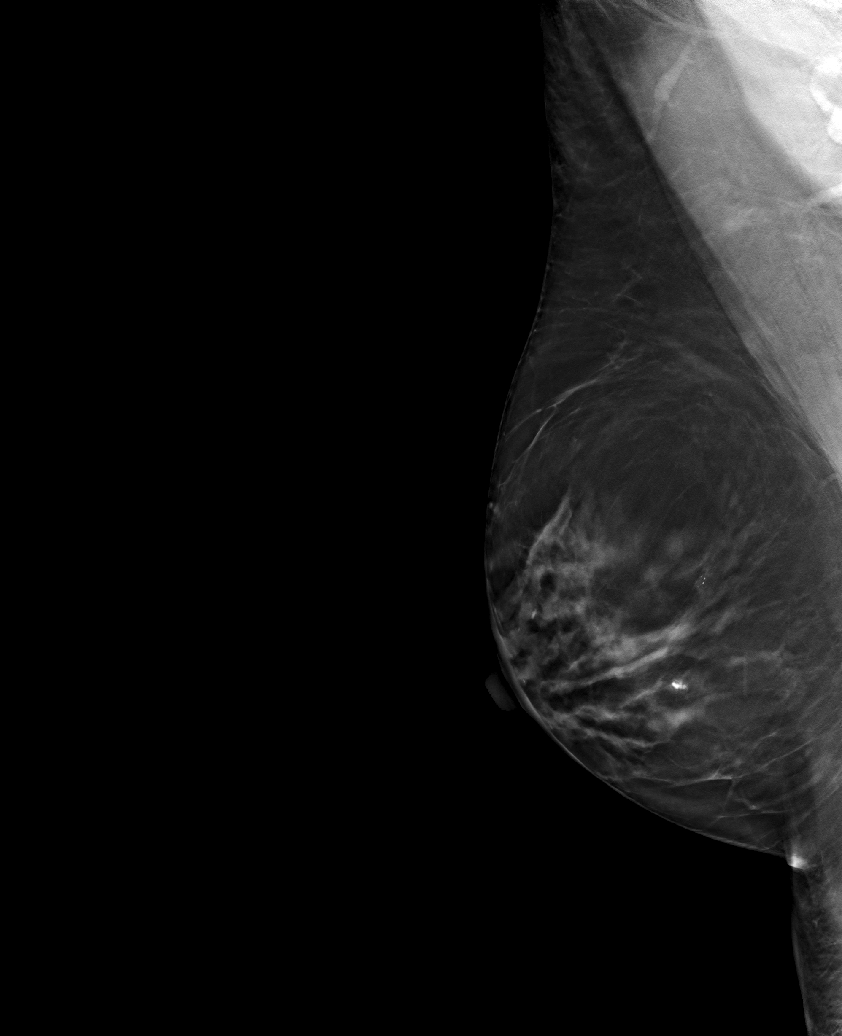

[6 of 30 positions shown; findings below may reference images not displayed]

ACR Breast Density Category c: The breast tissue is heterogeneously
dense, which may obscure small masses.
FINDINGS: In the left breast, a possible mass warrants further evaluation. In
the right breast, no findings suspicious for malignancy.

Images were processed with CAD.
IMPRESSION: Further evaluation is suggested for possible mass in the left
breast.

RECOMMENDATION:
Diagnostic mammogram and possibly ultrasound of the left breast.
(Code:IJ-8-HHX)

The patient will be contacted regarding the findings, and additional
imaging will be scheduled.

BI-RADS CATEGORY  0: Incomplete. Need additional imaging evaluation
and/or prior mammograms for comparison.

## 2020-07-29 HISTORY — PX: RETINAL DETACHMENT SURGERY: SHX105

## 2020-09-02 DIAGNOSIS — H33002 Unspecified retinal detachment with retinal break, left eye: Secondary | ICD-10-CM | POA: Diagnosis not present

## 2020-09-04 DIAGNOSIS — H33322 Round hole, left eye: Secondary | ICD-10-CM | POA: Diagnosis not present

## 2020-09-04 DIAGNOSIS — H43821 Vitreomacular adhesion, right eye: Secondary | ICD-10-CM | POA: Diagnosis not present

## 2020-09-04 DIAGNOSIS — H2702 Aphakia, left eye: Secondary | ICD-10-CM | POA: Diagnosis not present

## 2020-09-04 DIAGNOSIS — H33052 Total retinal detachment, left eye: Secondary | ICD-10-CM | POA: Diagnosis not present

## 2020-09-09 DIAGNOSIS — H33002 Unspecified retinal detachment with retinal break, left eye: Secondary | ICD-10-CM | POA: Diagnosis not present

## 2020-09-12 DIAGNOSIS — H33022 Retinal detachment with multiple breaks, left eye: Secondary | ICD-10-CM | POA: Diagnosis not present

## 2020-09-13 DIAGNOSIS — H33052 Total retinal detachment, left eye: Secondary | ICD-10-CM | POA: Diagnosis not present

## 2020-09-13 DIAGNOSIS — H33322 Round hole, left eye: Secondary | ICD-10-CM | POA: Diagnosis not present

## 2020-10-09 DIAGNOSIS — H33022 Retinal detachment with multiple breaks, left eye: Secondary | ICD-10-CM | POA: Diagnosis not present

## 2020-11-05 DIAGNOSIS — T85398D Other mechanical complication of other ocular prosthetic devices, implants and grafts, subsequent encounter: Secondary | ICD-10-CM | POA: Diagnosis not present

## 2020-11-28 DIAGNOSIS — Z48810 Encounter for surgical aftercare following surgery on the sense organs: Secondary | ICD-10-CM | POA: Diagnosis not present

## 2020-11-28 DIAGNOSIS — T85398A Other mechanical complication of other ocular prosthetic devices, implants and grafts, initial encounter: Secondary | ICD-10-CM | POA: Diagnosis not present

## 2020-11-28 DIAGNOSIS — Z8669 Personal history of other diseases of the nervous system and sense organs: Secondary | ICD-10-CM | POA: Diagnosis not present

## 2020-12-10 DIAGNOSIS — H59812 Chorioretinal scars after surgery for detachment, left eye: Secondary | ICD-10-CM | POA: Diagnosis not present

## 2020-12-19 ENCOUNTER — Ambulatory Visit: Payer: Federal, State, Local not specified - PPO | Admitting: Family Medicine

## 2020-12-27 HISTORY — PX: RETINAL DETACHMENT SURGERY: SHX105

## 2020-12-31 DIAGNOSIS — Q141 Congenital malformation of retina: Secondary | ICD-10-CM | POA: Diagnosis not present

## 2021-04-08 DIAGNOSIS — H59812 Chorioretinal scars after surgery for detachment, left eye: Secondary | ICD-10-CM | POA: Diagnosis not present

## 2021-06-09 DIAGNOSIS — Z0000 Encounter for general adult medical examination without abnormal findings: Secondary | ICD-10-CM | POA: Diagnosis not present

## 2021-06-09 DIAGNOSIS — Z13220 Encounter for screening for lipoid disorders: Secondary | ICD-10-CM | POA: Diagnosis not present

## 2021-06-09 DIAGNOSIS — I10 Essential (primary) hypertension: Secondary | ICD-10-CM | POA: Diagnosis not present

## 2021-06-09 DIAGNOSIS — M810 Age-related osteoporosis without current pathological fracture: Secondary | ICD-10-CM | POA: Diagnosis not present

## 2021-06-24 ENCOUNTER — Encounter: Payer: Federal, State, Local not specified - PPO | Admitting: Family Medicine

## 2021-12-08 ENCOUNTER — Ambulatory Visit (INDEPENDENT_AMBULATORY_CARE_PROVIDER_SITE_OTHER): Payer: Federal, State, Local not specified - PPO | Admitting: Family Medicine

## 2021-12-08 ENCOUNTER — Encounter: Payer: Self-pay | Admitting: Family Medicine

## 2021-12-08 DIAGNOSIS — I10 Essential (primary) hypertension: Secondary | ICD-10-CM | POA: Diagnosis not present

## 2021-12-08 LAB — LIPID PANEL
Cholesterol: 262 mg/dL — ABNORMAL HIGH (ref 0–200)
HDL: 84.9 mg/dL (ref >39.00)
LDL Cholesterol: 150 mg/dL — ABNORMAL HIGH (ref 0–99)
NonHDL: 177.2
Total CHOL/HDL Ratio: 3
Triglycerides: 138 mg/dL (ref 0.0–149.0)
VLDL: 27.6 mg/dL (ref 0.0–40.0)

## 2021-12-08 LAB — CBC WITH DIFFERENTIAL/PLATELET
Basophils Absolute: 0 10*3/uL (ref 0.0–0.1)
Basophils Relative: 0.4 % (ref 0.0–3.0)
Eosinophils Absolute: 0.1 10*3/uL (ref 0.0–0.7)
Eosinophils Relative: 2.3 % (ref 0.0–5.0)
HCT: 42.3 % (ref 36.0–46.0)
Hemoglobin: 13.5 g/dL (ref 12.0–15.0)
Lymphocytes Relative: 34.5 % (ref 12.0–46.0)
Lymphs Abs: 2.1 10*3/uL (ref 0.7–4.0)
MCHC: 32.1 g/dL (ref 30.0–36.0)
MCV: 85.5 fl (ref 78.0–100.0)
Monocytes Absolute: 0.8 10*3/uL (ref 0.1–1.0)
Monocytes Relative: 12.3 % — ABNORMAL HIGH (ref 3.0–12.0)
Neutro Abs: 3.1 10*3/uL (ref 1.4–7.7)
Neutrophils Relative %: 50.5 % (ref 43.0–77.0)
Platelets: 309 10*3/uL (ref 150.0–400.0)
RBC: 4.94 Mil/uL (ref 3.87–5.11)
RDW: 14.3 % (ref 11.5–15.5)
WBC: 6.1 10*3/uL (ref 4.0–10.5)

## 2021-12-08 LAB — COMPREHENSIVE METABOLIC PANEL
ALT: 18 U/L (ref 0–35)
AST: 19 U/L (ref 0–37)
Albumin: 4.6 g/dL (ref 3.5–5.2)
Alkaline Phosphatase: 53 U/L (ref 39–117)
BUN: 10 mg/dL (ref 6–23)
CO2: 31 mEq/L (ref 19–32)
Calcium: 10.1 mg/dL (ref 8.4–10.5)
Chloride: 96 mEq/L (ref 96–112)
Creatinine, Ser: 0.6 mg/dL (ref 0.40–1.20)
GFR: 95.86 mL/min (ref >60.00)
Glucose, Bld: 78 mg/dL (ref 70–99)
Potassium: 3.8 mEq/L (ref 3.5–5.1)
Sodium: 134 mEq/L — ABNORMAL LOW (ref 135–145)
Total Bilirubin: 0.9 mg/dL (ref 0.2–1.2)
Total Protein: 7.6 g/dL (ref 6.0–8.3)

## 2021-12-08 MED ORDER — LISINOPRIL-HYDROCHLOROTHIAZIDE 10-12.5 MG PO TABS: 1.0000 | ORAL_TABLET | Freq: Every day | ORAL | 3 refills | Status: DC

## 2021-12-08 NOTE — Patient Instructions (Signed)

## 2021-12-08 NOTE — Assessment & Plan Note (Signed)
Well controlled, no changes to meds. Encouraged heart healthy diet such as the DASH diet and exercise as tolerated.  °

## 2021-12-08 NOTE — Progress Notes (Signed)
Subjective:   By signing my name below, I, Savannah Deleon, attest that this documentation has been prepared under the direction and in the presence of Savannah Deleon, 12/08/2021   Patient ID: Savannah Deleon [facility], female    DOB: 1960/11/27, 61 y.o.   MRN: [id number]  Chief Complaint  Patient presents with   Annual Exam    Pt states fasting     HPI Patient is in today for an office visit.  She requests a refill on 10-12.5 MG of Lisinopril.   She is concerned about her cholesterol levels as they appear elevated. Her ratio appears good.  Lab Results  Component Value Date   CHOL 266 (H) 06/18/2020   HDL 73 06/18/2020   LDLCALC 169 (H) 06/18/2020   TRIG 109 06/18/2020   CHOLHDL 3.6 06/18/2020   She reports that she has undergone retinal detachment surgery on November 2021 and April 2022.  Patient has not received the flu vaccine. She has received one COVID - 19 vaccination.  Past Medical History:  Diagnosis Date   Allergy    Anemia    Asthma    Cataract    left eye    Environmental allergies    Heart murmur    Hypertension     Past Surgical History:  Procedure Laterality Date   CATARACT EXTRACTION Left    CYSTECTOMY  09/29/2011   Removed form the Back   RETINAL DETACHMENT SURGERY Left 07/2020   dr Corene Cornea sanders   RETINAL DETACHMENT SURGERY Left 12/2020   sanders    Family History  Problem Relation Age of Onset   Ovarian cancer Mother    Hypertension Mother    Colon cancer Father    Heart disease Sister    Heart failure Maternal Grandfather    Colon cancer Paternal Grandmother    Colon polyps Neg Hx    Esophageal cancer Neg Hx    Rectal cancer Neg Hx    Stomach cancer Neg Hx     Social History   Socioeconomic History   Marital status: Single    Spouse name: Not on file   Number of children: Not on file   Years of education: Not on file   Highest education level: Not on file  Occupational History   Occupation: retired  Tobacco Use   Smoking  status: Never   Smokeless tobacco: Never  Vaping Use   Vaping Use: Never used  Substance and Sexual Activity   Alcohol use: Yes    Comment: Seldom   Drug use: No   Sexual activity: Yes    Partners: Male  Other Topics Concern   Not on file  Social History Narrative   Exercise--limited,  [place] moved from [place]               Social Determinants of Health   Financial Resource Strain: Not on file  Food Insecurity: Not on file  Transportation Needs: Not on file  Physical Activity: Not on file  Stress: Not on file  Social Connections: Not on file  Intimate Partner Violence: Not on file    Outpatient Medications Prior to Visit  Medication Sig Dispense Refill   Multiple Vitamins-Minerals (MULTIVITAMIN PO) Take by mouth.     lisinopril-hydrochlorothiazide (ZESTORETIC) 10-12.5 MG tablet Take 1 tablet by mouth daily. 90 tablet 3   APPLE CIDER VINEGAR PO Take by mouth. (Patient not taking: Reported on 12/08/2021)     Omega-3 Fatty Acids (FISH OIL) 1200 MG CAPS Take by  mouth. (Patient not taking: Reported on 12/08/2021)     VITAMIN D PO Take by mouth. (Patient not taking: Reported on 12/08/2021)     VITAMIN E PO Take by mouth. (Patient not taking: Reported on 12/08/2021)     No facility-administered medications prior to visit.    Allergies  Allergen Reactions   Elemental Sulfur Anaphylaxis    SOB, Swelling, rash   Quinoa-Kale-Hemp [Alimentum]     Rash, swelling   Wheat Bran     Review of Systems  Constitutional:  Negative for fever and malaise/fatigue.  HENT:  Negative for congestion.   Eyes:  Negative for blurred vision.  Respiratory:  Negative for cough and shortness of breath.   Cardiovascular:  Negative for chest pain, palpitations and leg swelling.  Gastrointestinal:  Negative for vomiting.  Musculoskeletal:  Negative for back pain.  Skin:  Negative for rash.  Neurological:  Negative for loss of consciousness and headaches.      Objective:    Physical Exam Vitals and  nursing note reviewed.  Constitutional:      General: She is not in acute distress.    Appearance: Normal appearance. She is not ill-appearing.  HENT:     Head: Normocephalic and atraumatic.     Right Ear: External ear normal.     Left Ear: External ear normal.  Eyes:     Extraocular Movements: Extraocular movements intact.     Pupils: Pupils are equal, round, and reactive to light.  Cardiovascular:     Rate and Rhythm: Normal rate and regular rhythm.     Heart sounds: Normal heart sounds. No murmur heard.   No gallop.  Pulmonary:     Effort: Pulmonary effort is normal. No respiratory distress.     Breath sounds: Normal breath sounds. No wheezing or rales.  Abdominal:     General: Abdomen is flat.     Tenderness: There is no guarding or rebound.  Musculoskeletal:     Cervical back: Normal range of motion.     Right lower leg: No edema.     Left lower leg: No edema.  Skin:    General: Skin is warm and dry.     Findings: No rash.  Neurological:     General: No focal deficit present.     Mental Status: She is alert and oriented to person, place, and time. Mental status is at baseline.  Psychiatric:        Mood and Affect: Mood normal.        Thought Content: Thought content normal.        Judgment: Judgment normal.    BP 130/82 (BP Location: Left Arm, Patient Position: Sitting, Cuff Size: Normal)    Pulse 64    Temp (!) 97.4 F (36.3 C) (Oral)    Resp 16    Ht '5\' 6"'$  (1.676 m)    Wt 141 lb 12.8 oz (64.3 kg)    LMP 06/28/2013    SpO2 97%    BMI 22.89 kg/m  Wt Readings from Last 3 Encounters:  12/08/21 141 lb 12.8 oz (64.3 kg)  06/18/20 150 lb 12.8 oz (68.4 kg)  09/11/19 154 lb (69.9 kg)    Diabetic Foot Exam - Simple   No data filed    Lab Results  Component Value Date   WBC 5.7 06/13/2019   HGB 13.7 06/13/2019   HCT 43.2 06/13/2019   PLT 317.0 06/13/2019   GLUCOSE 83 06/18/2020   CHOL 266 (H) 06/18/2020  TRIG 109 06/18/2020   HDL 73 06/18/2020   LDLCALC 169  (H) 06/18/2020   ALT 16 06/18/2020   AST 19 06/18/2020   NA 136 06/18/2020   K 4.5 06/18/2020   CL 98 06/18/2020   CREATININE 0.73 06/18/2020   BUN 13 06/18/2020   CO2 30 06/18/2020   TSH 1.75 06/13/2019    Lab Results  Component Value Date   TSH 1.75 06/13/2019   Lab Results  Component Value Date   WBC 5.7 06/13/2019   HGB 13.7 06/13/2019   HCT 43.2 06/13/2019   MCV 85.3 06/13/2019   PLT 317.0 06/13/2019   Lab Results  Component Value Date   NA 136 06/18/2020   K 4.5 06/18/2020   CO2 30 06/18/2020   GLUCOSE 83 06/18/2020   BUN 13 06/18/2020   CREATININE 0.73 06/18/2020   BILITOT 0.8 06/18/2020   ALKPHOS 54 06/13/2019   AST 19 06/18/2020   ALT 16 06/18/2020   PROT 8.0 06/18/2020   ALBUMIN 4.3 06/13/2019   CALCIUM 10.5 (H) 06/18/2020   GFR 104.86 06/13/2019   Lab Results  Component Value Date   CHOL 266 (H) 06/18/2020   Lab Results  Component Value Date   HDL 73 06/18/2020   Lab Results  Component Value Date   LDLCALC 169 (H) 06/18/2020   Lab Results  Component Value Date   TRIG 109 06/18/2020   Lab Results  Component Value Date   CHOLHDL 3.6 06/18/2020   No results found for: HGBA1C     Assessment & Plan:   Problem List Items Addressed This Visit       Unprioritized   HTN (hypertension)    Well controlled, no changes to meds. Encouraged heart healthy diet such as the DASH diet and exercise as tolerated.       Relevant Medications   lisinopril-hydrochlorothiazide (ZESTORETIC) 10-12.5 MG tablet   Other Visit Diagnoses     Essential hypertension       Relevant Medications   lisinopril-hydrochlorothiazide (ZESTORETIC) 10-12.5 MG tablet   Other Relevant Orders   Comprehensive metabolic panel   Lipid panel   CBC with Differential/Platelet         Meds ordered this encounter  Medications   lisinopril-hydrochlorothiazide (ZESTORETIC) 10-12.5 MG tablet    Sig: Take 1 tablet by mouth daily.    Dispense:  90 tablet    Refill:  3     I, Ann Held, Deleon, personally preformed the services described in this documentation.  All medical record entries made by the scribe were at my direction and in my presence.  I have reviewed the chart and discharge instructions (if applicable) and agree that the record reflects my personal performance and is accurate and complete. 12/08/2021   I,Amber Collins,acting as a scribe for [employer], Deleon.,have documented all relevant documentation on the behalf of Ann Held, Deleon,as directed by  Ann Held, Deleon while in the presence of Ann Held, Deleon.    Ann Held, Deleon

## 2021-12-14 ENCOUNTER — Encounter: Payer: Self-pay | Admitting: Family Medicine

## 2022-01-20 ENCOUNTER — Encounter: Payer: Self-pay | Admitting: Family Medicine

## 2022-03-19 ENCOUNTER — Encounter: Payer: Self-pay | Admitting: Family Medicine

## 2022-03-24 ENCOUNTER — Other Ambulatory Visit: Payer: Self-pay | Admitting: Family Medicine

## 2022-03-24 DIAGNOSIS — N6320 Unspecified lump in the left breast, unspecified quadrant: Secondary | ICD-10-CM

## 2022-03-24 DIAGNOSIS — Z803 Family history of malignant neoplasm of breast: Secondary | ICD-10-CM

## 2022-03-24 DIAGNOSIS — Z09 Encounter for follow-up examination after completed treatment for conditions other than malignant neoplasm: Secondary | ICD-10-CM

## 2022-03-26 DIAGNOSIS — K080 Exfoliation of teeth due to systemic causes: Secondary | ICD-10-CM | POA: Diagnosis not present

## 2022-04-05 DIAGNOSIS — S90852A Superficial foreign body, left foot, initial encounter: Secondary | ICD-10-CM | POA: Diagnosis not present

## 2022-04-09 ENCOUNTER — Telehealth: Payer: Self-pay | Admitting: Genetic Counselor

## 2022-04-09 DIAGNOSIS — S91302A Unspecified open wound, left foot, initial encounter: Secondary | ICD-10-CM | POA: Diagnosis not present

## 2022-04-09 NOTE — Telephone Encounter (Signed)
Scheduled appt per 6/27 referral. Pt is aware of appt date and time. Pt is aware to arrive 15 mins prior to appt time and to bring and updated insurance card. Pt is aware of appt location.

## 2022-05-13 DIAGNOSIS — L578 Other skin changes due to chronic exposure to nonionizing radiation: Secondary | ICD-10-CM | POA: Diagnosis not present

## 2022-05-13 DIAGNOSIS — L821 Other seborrheic keratosis: Secondary | ICD-10-CM | POA: Diagnosis not present

## 2022-05-13 DIAGNOSIS — D225 Melanocytic nevi of trunk: Secondary | ICD-10-CM | POA: Diagnosis not present

## 2022-05-13 DIAGNOSIS — L814 Other melanin hyperpigmentation: Secondary | ICD-10-CM | POA: Diagnosis not present

## 2022-05-19 DIAGNOSIS — H33052 Total retinal detachment, left eye: Secondary | ICD-10-CM | POA: Diagnosis not present

## 2022-05-20 ENCOUNTER — Ambulatory Visit
Admission: RE | Admit: 2022-05-20 | Discharge: 2022-05-20 | Disposition: A | Discharge status: Home / Self Care | Payer: Federal, State, Local not specified - PPO | Source: Ambulatory Visit | Attending: Family Medicine | Admitting: Family Medicine

## 2022-05-20 DIAGNOSIS — R922 Inconclusive mammogram: Secondary | ICD-10-CM | POA: Diagnosis not present

## 2022-05-20 DIAGNOSIS — N6320 Unspecified lump in the left breast, unspecified quadrant: Secondary | ICD-10-CM

## 2022-05-20 DIAGNOSIS — Z09 Encounter for follow-up examination after completed treatment for conditions other than malignant neoplasm: Secondary | ICD-10-CM

## 2022-05-20 DIAGNOSIS — N6322 Unspecified lump in the left breast, upper inner quadrant: Secondary | ICD-10-CM | POA: Diagnosis not present

## 2022-05-20 DIAGNOSIS — N6324 Unspecified lump in the left breast, lower inner quadrant: Secondary | ICD-10-CM | POA: Diagnosis not present

## 2022-05-26 ENCOUNTER — Other Ambulatory Visit: Payer: Self-pay | Admitting: Genetic Counselor

## 2022-05-26 DIAGNOSIS — Z800 Family history of malignant neoplasm of digestive organs: Secondary | ICD-10-CM

## 2022-05-27 ENCOUNTER — Inpatient Hospital Stay: Payer: Federal, State, Local not specified - PPO | Attending: Genetic Counselor | Admitting: Genetic Counselor

## 2022-05-27 ENCOUNTER — Other Ambulatory Visit: Payer: Self-pay

## 2022-05-27 ENCOUNTER — Inpatient Hospital Stay: Payer: Federal, State, Local not specified - PPO

## 2022-05-27 ENCOUNTER — Encounter: Payer: Self-pay | Admitting: Genetic Counselor

## 2022-05-27 DIAGNOSIS — Z8041 Family history of malignant neoplasm of ovary: Secondary | ICD-10-CM

## 2022-05-27 DIAGNOSIS — Z800 Family history of malignant neoplasm of digestive organs: Secondary | ICD-10-CM

## 2022-05-27 DIAGNOSIS — Z860100 Personal history of colon polyps, unspecified: Secondary | ICD-10-CM

## 2022-05-27 HISTORY — DX: Personal history of colon polyps, unspecified: Z86.0100

## 2022-05-27 NOTE — Progress Notes (Signed)
REFERRING PROVIDER: Ann Held, DO [address] [place],   [address]  PRIMARY PROVIDER:  Carollee Herter, Alferd Apa, DO  PRIMARY REASON FOR VISIT:  1. Family history of ovarian cancer   2. Family history of colon cancer   3. History of colonic polyps      HISTORY OF PRESENT ILLNESS:   Savannah Deleon, a 61 y.o. female, was seen for a [place] cancer genetics consultation at the request of Dr. Carollee Herter due to a family history of colon and ovarian cancer and a personal history of colon polyps.  Savannah Deleon presents to clinic today to discuss the possibility of a hereditary predisposition to cancer, genetic testing, and to further clarify her future cancer risks, as well as potential cancer risks for family members.   Savannah Deleon is a 61 y.o. female with no personal history of cancer.    CANCER HISTORY:  Oncology History   No history exists.     RISK FACTORS:  Menarche was at age 53.  First live birth at age 87.  OCP use for approximately  19  years.  Ovaries intact: yes.  Hysterectomy: no.  Menopausal status: postmenopausal.  HRT use: 0 years. Colonoscopy: yes;  9 tubular adenomas . Mammogram within the last year: yes. Number of breast biopsies: 1. Up to date with pelvic exams: yes. Any excessive radiation exposure in the past: no  Past Medical History:  Diagnosis Date   Allergy    Anemia    Asthma    Cataract    left eye    Colon polyps    Environmental allergies    Family history of colon cancer    Family history of ovarian cancer    Heart murmur    Hypertension     Past Surgical History:  Procedure Laterality Date   CATARACT EXTRACTION Left    CYSTECTOMY  09/29/2011   Removed form the Back   RETINAL DETACHMENT SURGERY Left 07/2020   dr Corene Cornea sanders   RETINAL DETACHMENT SURGERY Left 12/2020   sanders    Social History   Socioeconomic History   Marital status: Single    Spouse name: Not on file   Number of children: Not on  file   Years of education: Not on file   Highest education level: Not on file  Occupational History   Occupation: retired  Tobacco Use   Smoking status: Never   Smokeless tobacco: Never  Vaping Use   Vaping Use: Never used  Substance and Sexual Activity   Alcohol use: Yes    Comment: Seldom   Drug use: No   Sexual activity: Yes    Partners: Male  Other Topics Concern   Not on file  Social History Narrative   Exercise--limited,  Just moved from [place]               Social Determinants of Health   Financial Resource Strain: Not on file  Food Insecurity: Not on file  Transportation Needs: Not on file  Physical Activity: Not on file  Stress: Not on file  Social Connections: Not on file     FAMILY HISTORY:  We obtained a detailed, 4-generation family history.  Significant diagnoses are listed below: Family History  Problem Relation Age of Onset   Ovarian cancer Mother 59   Hypertension Mother    Colon cancer Father 85   Heart disease Sister 23   Heart failure Maternal Grandfather 71   Colon  cancer Paternal Grandmother        dx 44s, d. 77   Pancreatic cancer Other        paternal grandmother's paternal aunt   Colon polyps Neg Hx    Esophageal cancer Neg Hx    Rectal cancer Neg Hx    Stomach cancer Neg Hx       The patient has one daughter who is cancer free.  She has three sisters who are cancer free. Both parents are deceased.  The patient's mother had ovarian cancer at 42.  She has five brothers and five sisters who were cancer free.  Her parents died of non cancer related issues.  The patient's father had colon cancer.  He was an only child.  His mother had colon cancer in her 65's and her paternal aunt had pancreatic cancer.  Savannah Deleon is unaware of previous family history of genetic testing for hereditary cancer risks. Patient's maternal ancestors are of [place] descent, and paternal ancestors are of European descent. There is no reported Ashkenazi Jewish  ancestry. There is no known consanguinity.  GENETIC COUNSELING ASSESSMENT: Savannah Deleon is a 61 y.o. female with a family history of colon and ovarian cancer and personal history of colon polyps which is somewhat suggestive of a hereditary cancer syndrome and predisposition to cancer given the family history of ovarian cancer. We, therefore, discussed and recommended the following at today's visit.   DISCUSSION: We discussed that, in general, most cancer is not inherited in families, but instead is sporadic or familial. Sporadic cancers occur by chance and typically happen at older ages (>50 years) as this type of cancer is caused by genetic changes acquired during an individual's lifetime. Some families have more cancers than would be expected by chance; however, the ages or types of cancer are not consistent with a known genetic mutation or known genetic mutations have been ruled out. This type of familial cancer is thought to be due to a combination of multiple genetic, environmental, hormonal, and lifestyle factors. While this combination of factors likely increases the risk of cancer, the exact source of this risk is not currently identifiable or testable.  We discussed that up to 20% of ovarian cancer is hereditary, with most cases associated with BRCA mutations.  There are other genes that can be associated with hereditary ovarian cancer syndromes.  These include BRIP1, RAD51C, RAD51D and Lynch syndrome.  We discussed that testing is beneficial for several reasons including knowing how to follow individuals after completing their treatment, identifying whether potential treatment options such as PARP inhibitors would be beneficial, and understand if other family members could be at risk for cancer and allow them to undergo genetic testing.   We discussed that some people do not want to undergo genetic testing due to fear of genetic discrimination.  A federal law called the Genetic Information  Non-Discrimination Act (GINA) of 2008 helps protect individuals against genetic discrimination based on their genetic test results.  It impacts both health insurance and employment.  With health insurance, it protects against increased premiums, being kicked off insurance or being forced to take a test in order to be insured.  For employment it protects against hiring, firing and promoting decisions based on genetic test results.  Health status due to a cancer diagnosis is not protected under GINA.  We reviewed the characteristics, features and inheritance patterns of hereditary cancer syndromes. We also discussed genetic testing, including the appropriate family members to test, the process of testing,  insurance coverage and turn-around-time for results. We discussed the implications of a negative, positive, carrier and/or variant of uncertain significant result. We recommended Savannah Deleon pursue genetic testing for the CancerNext-Expanded+RNAinsight gene panel.   The CancerNext-Expanded gene panel offered by [facility] and includes sequencing and rearrangement analysis for the following 77 genes: AIP, ALK, APC*, ATM*, AXIN2, BAP1, BARD1, BLM, BMPR1A, BRCA1*, BRCA2*, BRIP1*, CDC73, CDH1*, CDK4, CDKN1B, CDKN2A, CHEK2*, CTNNA1, DICER1, FANCC, FH, FLCN, GALNT12, KIF1B, LZTR1, MAX, MEN1, MET, MLH1*, MSH2*, MSH3, MSH6*, MUTYH*, NBN, NF1*, NF2, NTHL1, PALB2*, PHOX2B, PMS2*, POT1, PRKAR1A, PTCH1, PTEN*, RAD51C*, RAD51D*, RB1, RECQL, RET, SDHA, SDHAF2, SDHB, SDHC, SDHD, SMAD4, SMARCA4, SMARCB1, SMARCE1, STK11, SUFU, TMEM127, TP53*, TSC1, TSC2, VHL and XRCC2 (sequencing and deletion/duplication); EGFR, EGLN1, HOXB13, KIT, MITF, PDGFRA, POLD1, and POLE (sequencing only); EPCAM and GREM1 (deletion/duplication only). DNA and RNA analyses performed for * genes.   Based on Savannah Deleon's family history of cancer, she meets medical criteria for genetic testing. Despite that she meets criteria, she may still have an out of  pocket cost. We discussed that if her out of pocket cost for testing is over $100, the laboratory will call and confirm whether she wants to proceed with testing.  If the out of pocket cost of testing is less than $100 she will be billed by the genetic testing laboratory.   PLAN: Despite our recommendation, Savannah Deleon did not wish to pursue genetic testing at today's visit. We understand this decision and remain available to coordinate genetic testing at any time in the future. We, therefore, recommend Savannah Deleon continue to follow the cancer screening guidelines given by her primary healthcare provider.  Lastly, we encouraged Savannah Deleon to remain in contact with cancer genetics annually so that we can continuously update the family history and inform her of any changes in cancer genetics and testing that may be of benefit for this family.   Savannah Deleon questions were answered to her satisfaction today. Our contact information was provided should additional questions or concerns arise. Thank you for the referral and allowing [place] to share in the care of your patient.   Savannah Deleon P. Florene Glen, [place], [facility] Licensed, Insurance risk surveyor [email or web address] phone: [phone]  The patient was seen for a total of 35 minutes in face-to-face genetic counseling.  The patient was seen alone.  Drs. Michell Heinrich, and/or [place] were available for questions, if needed..    _______________________________________________________________________ For Office Staff:  Number of people involved in session: 1 Was an Intern/ student involved with case: yes Carloyn Manner

## 2022-06-05 DIAGNOSIS — E7800 Pure hypercholesterolemia, unspecified: Secondary | ICD-10-CM | POA: Insufficient documentation

## 2022-06-05 DIAGNOSIS — N819 Female genital prolapse, unspecified: Secondary | ICD-10-CM | POA: Diagnosis not present

## 2022-06-05 DIAGNOSIS — Z780 Asymptomatic menopausal state: Secondary | ICD-10-CM | POA: Diagnosis not present

## 2022-06-05 DIAGNOSIS — Z01419 Encounter for gynecological examination (general) (routine) without abnormal findings: Secondary | ICD-10-CM | POA: Diagnosis not present

## 2022-06-05 DIAGNOSIS — Z13820 Encounter for screening for osteoporosis: Secondary | ICD-10-CM | POA: Diagnosis not present

## 2022-06-05 HISTORY — DX: Pure hypercholesterolemia, unspecified: E78.00

## 2022-06-15 ENCOUNTER — Encounter: Payer: Federal, State, Local not specified - PPO | Admitting: Family Medicine

## 2022-06-22 ENCOUNTER — Encounter: Payer: Self-pay | Admitting: Family Medicine

## 2022-06-22 ENCOUNTER — Ambulatory Visit (INDEPENDENT_AMBULATORY_CARE_PROVIDER_SITE_OTHER): Payer: Federal, State, Local not specified - PPO | Admitting: Family Medicine

## 2022-06-22 VITALS — BP 118/70 | HR 75 | Temp 98.1°F | Resp 18 | Ht 66.0 in | Wt 145.8 lb

## 2022-06-22 DIAGNOSIS — Z0000 Encounter for general adult medical examination without abnormal findings: Secondary | ICD-10-CM | POA: Diagnosis not present

## 2022-06-22 DIAGNOSIS — Z23 Encounter for immunization: Secondary | ICD-10-CM | POA: Diagnosis not present

## 2022-06-22 DIAGNOSIS — I10 Essential (primary) hypertension: Secondary | ICD-10-CM | POA: Diagnosis not present

## 2022-06-22 MED ORDER — LISINOPRIL-HYDROCHLOROTHIAZIDE 10-12.5 MG PO TABS: 1.0000 | ORAL_TABLET | Freq: Every day | ORAL | 3 refills | Status: DC

## 2022-06-22 NOTE — Assessment & Plan Note (Signed)
Well controlled, no changes to meds. Encouraged heart healthy diet such as the DASH diet and exercise as tolerated.  °

## 2022-06-22 NOTE — Assessment & Plan Note (Signed)
ghm utd Check labs  See avs  

## 2022-06-22 NOTE — Patient Instructions (Signed)
Preventive Care 40-64 Years Old, Female Preventive care refers to lifestyle choices and visits with your health care provider that can promote health and wellness. Preventive care visits are also called wellness exams. What can I expect for my preventive care visit? Counseling Your health care provider may ask you questions about your: Medical history, including: Past medical problems. Family medical history. Pregnancy history. Current health, including: Menstrual cycle. Method of birth control. Emotional well-being. Home life and relationship well-being. Sexual activity and sexual health. Lifestyle, including: Alcohol, nicotine or tobacco, and drug use. Access to firearms. Diet, exercise, and sleep habits. Work and work environment. Sunscreen use. Safety issues such as seatbelt and bike helmet use. Physical exam Your health care provider will check your: Height and weight. These may be used to calculate your BMI (body mass index). BMI is a measurement that tells if you are at a healthy weight. Waist circumference. This measures the distance around your waistline. This measurement also tells if you are at a healthy weight and may help predict your risk of certain diseases, such as type 2 diabetes and high blood pressure. Heart rate and blood pressure. Body temperature. Skin for abnormal spots. What immunizations do I need?  Vaccines are usually given at various ages, according to a schedule. Your health care provider will recommend vaccines for you based on your age, medical history, and lifestyle or other factors, such as travel or where you work. What tests do I need? Screening Your health care provider may recommend screening tests for certain conditions. This may include: Lipid and cholesterol levels. Diabetes screening. This is done by checking your blood sugar (glucose) after you have not eaten for a while (fasting). Pelvic exam and Pap test. Hepatitis B test. Hepatitis C  test. HIV (human immunodeficiency virus) test. STI (sexually transmitted infection) testing, if you are at risk. Lung cancer screening. Colorectal cancer screening. Mammogram. Talk with your health care provider about when you should start having regular mammograms. This may depend on whether you have a family history of breast cancer. BRCA-related cancer screening. This may be done if you have a family history of breast, ovarian, tubal, or peritoneal cancers. Bone density scan. This is done to screen for osteoporosis. Talk with your health care provider about your test results, treatment options, and if necessary, the need for more tests. Follow these instructions at home: Eating and drinking  Eat a diet that includes fresh fruits and vegetables, whole grains, lean protein, and low-fat dairy products. Take vitamin and mineral supplements as recommended by your health care provider. Do not drink alcohol if: Your health care provider tells you not to drink. You are pregnant, may be pregnant, or are planning to become pregnant. If you drink alcohol: Limit how much you have to 0-1 drink a day. Know how much alcohol is in your drink. In the [place] one drink equals one 12 oz bottle of beer (355 mL), one 5 oz glass of wine (148 mL), or one 1 oz glass of hard liquor (44 mL). Lifestyle Brush your teeth every morning and night with fluoride toothpaste. Floss one time each day. Exercise for at least 30 minutes 5 or more days each week. Do not use any products that contain nicotine or tobacco. These products include cigarettes, chewing tobacco, and vaping devices, such as e-cigarettes. If you need help quitting, ask your health care provider. Do not use drugs. If you are sexually active, practice safe sex. Use a condom or other form of protection to   prevent STIs. If you do not wish to become pregnant, use a form of birth control. If you plan to become pregnant, see your health care provider for a  prepregnancy visit. Take aspirin only as told by your health care provider. Make sure that you understand how much to take and what form to take. Work with your health care provider to find out whether it is safe and beneficial for you to take aspirin daily. Find healthy ways to manage stress, such as: Meditation, yoga, or listening to music. Journaling. Talking to a trusted person. Spending time with friends and family. Minimize exposure to UV radiation to reduce your risk of skin cancer. Safety Always wear your seat belt while driving or riding in a vehicle. Do not drive: If you have been drinking alcohol. Do not ride with someone who has been drinking. When you are tired or distracted. While texting. If you have been using any mind-altering substances or drugs. Wear a helmet and other protective equipment during sports activities. If you have firearms in your house, make sure you follow all gun safety procedures. Seek help if you have been physically or sexually abused. What's next? Visit your health care provider once a year for an annual wellness visit. Ask your health care provider how often you should have your eyes and teeth checked. Stay up to date on all vaccines. This information is not intended to replace advice given to you by your health care provider. Make sure you discuss any questions you have with your health care provider. Document Revised: 03/12/2021 Document Reviewed: 03/12/2021 Elsevier Patient Education  [place].

## 2022-06-22 NOTE — Progress Notes (Signed)
Subjective:   By signing my name below, I, Carylon Perches, attest that this documentation has been prepared under the direction and in the presence of Roma Schanz R DO 06/22/2022.   Patient ID: Savannah Deleon [facility], female    DOB: 09/12/1961, 61 y.o.   MRN: [id number]  Chief Complaint  Patient presents with   Annual Exam    Pt states fasting     HPI Patient is in today for a comprehensive physical exam.  She is requesting to get 10-12.5 mg Zestoretic to be refilled.   She transferred to Dr. Charlesetta Garibaldi at [facility] for [place].  She denies having any fever, new muscle pain, joint pain , new moles, congestion, sinus pain, sore throat, chest pain, palpations, cough, SOB ,wheezing,n/v/d constipation, blood in stool, dysuria, frequency, hematuria, at this time.  She denies any changes to family history. She denies any new surgeries.  She last completed colonoscopy on 09/11/2019. She last completed a pap smear on 11/07/2011 She had her last mammogram on 12/27/2012. She is not interested in getting the influenza vaccine during this visit. She is interested in getting the tetanus vaccine during this visit. She has had 1 Pfizer vaccine.  She reports that she does limited exercise.     Past Medical History:  Diagnosis Date   Allergy    Anemia    Asthma    Cataract    left eye    Colon polyps    Environmental allergies    Family history of colon cancer    Family history of ovarian cancer    Heart murmur    Hypertension     Past Surgical History:  Procedure Laterality Date   CATARACT EXTRACTION Left    CYSTECTOMY  09/29/2011   Removed form the Back   RETINAL DETACHMENT SURGERY Left 07/2020   dr Corene Cornea sanders   RETINAL DETACHMENT SURGERY Left 12/2020   sanders    Family History  Problem Relation Age of Onset   Ovarian cancer Mother 37   Hypertension Mother    Colon cancer Father 42   Heart disease Sister 28   Heart failure Maternal Grandfather 61   Colon  cancer Paternal Grandmother        dx 54s, d. 36   Pancreatic cancer Other        paternal grandmother's paternal aunt   Colon polyps Neg Hx    Esophageal cancer Neg Hx    Rectal cancer Neg Hx    Stomach cancer Neg Hx     Social History   Socioeconomic History   Marital status: Single    Spouse name: Not on file   Number of children: Not on file   Years of education: Not on file   Highest education level: Not on file  Occupational History   Occupation: retired  Tobacco Use   Smoking status: Never   Smokeless tobacco: Never  Vaping Use   Vaping Use: Never used  Substance and Sexual Activity   Alcohol use: Yes    Comment: Seldom   Drug use: No   Sexual activity: Yes    Partners: Male  Other Topics Concern   Not on file  Social History Narrative   Exercise--limited,     Social Determinants of Health   Financial Resource Strain: Not on file  Food Insecurity: Not on file  Transportation Needs: Not on file  Physical Activity: Not on file  Stress: Not on file  Social Connections: Not on file  Intimate Partner Violence: Not on file    Outpatient Medications Prior to Visit  Medication Sig Dispense Refill   Multiple Vitamins-Minerals (MULTIVITAMIN PO) Take by mouth.     lisinopril-hydrochlorothiazide (ZESTORETIC) 10-12.5 MG tablet Take 1 tablet by mouth daily. 90 tablet 3   No facility-administered medications prior to visit.    Allergies  Allergen Reactions   Elemental Sulfur Anaphylaxis    SOB, Swelling, rash   Quinoa-Kale-Hemp [Alimentum]     Rash, swelling   Wheat Bran     Review of Systems  Constitutional:  Negative for fever and malaise/fatigue.  HENT:  Negative for congestion, sinus pain and sore throat.   Eyes:  Negative for blurred vision.  Respiratory:  Negative for cough, shortness of breath and wheezing.   Cardiovascular:  Negative for chest pain, palpitations and leg swelling.  Gastrointestinal:  Negative for abdominal pain, blood in stool,  constipation, diarrhea, nausea and vomiting.  Genitourinary:  Negative for dysuria, frequency and hematuria.  Musculoskeletal:  Negative for falls, joint pain and myalgias.  Skin:  Negative for rash.       (-) [place]  Neurological:  Negative for dizziness, loss of consciousness and headaches.  Endo/Heme/Allergies:  Negative for environmental allergies.  Psychiatric/Behavioral:  Negative for depression. The patient is not nervous/anxious.        Objective:    Physical Exam Vitals and nursing note reviewed.  Constitutional:      General: She is not in acute distress.    Appearance: Normal appearance. She is not ill-appearing.  HENT:     Head: Normocephalic and atraumatic.     Right Ear: Tympanic membrane, ear canal and external ear normal.     Left Ear: Tympanic membrane, ear canal and external ear normal.  Eyes:     Extraocular Movements: Extraocular movements intact.     Pupils: Pupils are equal, round, and reactive to light.  Cardiovascular:     Rate and Rhythm: Normal rate and regular rhythm.     Heart sounds: Normal heart sounds. No murmur heard.    No gallop.  Pulmonary:     Effort: No respiratory distress.     Breath sounds: Normal breath sounds. No wheezing or rales.  Abdominal:     General: Bowel sounds are normal. There is no distension.     Palpations: Abdomen is soft.     Tenderness: There is no abdominal tenderness. There is no guarding.  Skin:    General: Skin is warm and dry.  Neurological:     Mental Status: She is alert and oriented to person, place, and time.  Psychiatric:        Judgment: Judgment normal.     BP 118/70 (BP Location: Right Arm, Patient Position: Sitting, Cuff Size: Normal)   Pulse 75   Temp 98.1 F (36.7 C) (Oral)   Resp 18   Ht '5\' 6"'$  (1.676 m)   Wt 145 lb 12.8 oz (66.1 kg)   LMP 06/28/2013   SpO2 97%   BMI 23.53 kg/m  Wt Readings from Last 3 Encounters:  06/22/22 145 lb 12.8 oz (66.1 kg)  12/08/21 141 lb 12.8 oz (64.3 kg)   06/18/20 150 lb 12.8 oz (68.4 kg)    Diabetic Foot Exam - Simple   No data filed    Lab Results  Component Value Date   WBC 6.1 12/08/2021   HGB 13.5 12/08/2021   HCT 42.3 12/08/2021   PLT 309.0 12/08/2021   GLUCOSE 78 12/08/2021  CHOL 262 (H) 12/08/2021   TRIG 138.0 12/08/2021   HDL 84.90 12/08/2021   LDLCALC 150 (H) 12/08/2021   ALT 18 12/08/2021   AST 19 12/08/2021   NA 134 (L) 12/08/2021   K 3.8 12/08/2021   CL 96 12/08/2021   CREATININE 0.60 12/08/2021   BUN 10 12/08/2021   CO2 31 12/08/2021   TSH 1.75 06/13/2019    Lab Results  Component Value Date   TSH 1.75 06/13/2019   Lab Results  Component Value Date   WBC 6.1 12/08/2021   HGB 13.5 12/08/2021   HCT 42.3 12/08/2021   MCV 85.5 12/08/2021   PLT 309.0 12/08/2021   Lab Results  Component Value Date   NA 134 (L) 12/08/2021   K 3.8 12/08/2021   CO2 31 12/08/2021   GLUCOSE 78 12/08/2021   BUN 10 12/08/2021   CREATININE 0.60 12/08/2021   BILITOT 0.9 12/08/2021   ALKPHOS 53 12/08/2021   AST 19 12/08/2021   ALT 18 12/08/2021   PROT 7.6 12/08/2021   ALBUMIN 4.6 12/08/2021   CALCIUM 10.1 12/08/2021   GFR 95.86 12/08/2021   Lab Results  Component Value Date   CHOL 262 (H) 12/08/2021   Lab Results  Component Value Date   HDL 84.90 12/08/2021   Lab Results  Component Value Date   LDLCALC 150 (H) 12/08/2021   Lab Results  Component Value Date   TRIG 138.0 12/08/2021   Lab Results  Component Value Date   CHOLHDL 3 12/08/2021   No results found for: "HGBA1C"     Assessment & Plan:   Problem List Items Addressed This Visit       Unprioritized   Preventative health care - Primary    ghm utd Check labs  See avs      Relevant Orders   CBC with Differential/Platelet   Comprehensive metabolic panel   Lipid panel   TSH   Other Visit Diagnoses     Essential hypertension       Relevant Medications   lisinopril-hydrochlorothiazide (ZESTORETIC) 10-12.5 MG tablet   Other Relevant  Orders   CBC with Differential/Platelet   Comprehensive metabolic panel   Lipid panel   TSH   Need for Tdap vaccination       Relevant Orders   Tdap vaccine greater than or equal to 7yo IM        Meds ordered this encounter  Medications   lisinopril-hydrochlorothiazide (ZESTORETIC) 10-12.5 MG tablet    Sig: Take 1 tablet by mouth daily.    Dispense:  90 tablet    Refill:  3    I, Ann Held, DO, personally preformed the services described in this documentation.  All medical record entries made by the scribe were at my direction and in my presence.  I have reviewed the chart and discharge instructions (if applicable) and agree that the record reflects my personal performance and is accurate and complete. 06/22/2022   I,Amber Collins,acting as a scribe for Ann Held, DO.,have documented all relevant documentation on the behalf of Ann Held, DO,as directed by  Ann Held, DO while in the presence of Ann Held, DO.    Ann Held, DO

## 2022-06-23 LAB — COMPREHENSIVE METABOLIC PANEL
ALT: 23 U/L (ref 0–35)
AST: 23 U/L (ref 0–37)
Albumin: 4.6 g/dL (ref 3.5–5.2)
Alkaline Phosphatase: 59 U/L (ref 39–117)
BUN: 9 mg/dL (ref 6–23)
CO2: 30 mEq/L (ref 19–32)
Calcium: 9.9 mg/dL (ref 8.4–10.5)
Chloride: 98 mEq/L (ref 96–112)
Creatinine, Ser: 0.72 mg/dL (ref 0.40–1.20)
GFR: 88.95 mL/min (ref >60.00)
Glucose, Bld: 82 mg/dL (ref 70–99)
Potassium: 3.7 mEq/L (ref 3.5–5.1)
Sodium: 135 mEq/L (ref 135–145)
Total Bilirubin: 0.8 mg/dL (ref 0.2–1.2)
Total Protein: 8 g/dL (ref 6.0–8.3)

## 2022-06-23 LAB — LIPID PANEL
Cholesterol: 261 mg/dL — ABNORMAL HIGH (ref 0–200)
HDL: 82.9 mg/dL (ref >39.00)
LDL Cholesterol: 155 mg/dL — ABNORMAL HIGH (ref 0–99)
NonHDL: 178.26
Total CHOL/HDL Ratio: 3
Triglycerides: 118 mg/dL (ref 0.0–149.0)
VLDL: 23.6 mg/dL (ref 0.0–40.0)

## 2022-06-23 LAB — CBC WITH DIFFERENTIAL/PLATELET
Basophils Absolute: 0.1 10*3/uL (ref 0.0–0.1)
Basophils Relative: 1 % (ref 0.0–3.0)
Eosinophils Absolute: 0.1 10*3/uL (ref 0.0–0.7)
Eosinophils Relative: 1.9 % (ref 0.0–5.0)
HCT: 42 % (ref 36.0–46.0)
Hemoglobin: 13.7 g/dL (ref 12.0–15.0)
Lymphocytes Relative: 34.4 % (ref 12.0–46.0)
Lymphs Abs: 1.9 10*3/uL (ref 0.7–4.0)
MCHC: 32.7 g/dL (ref 30.0–36.0)
MCV: 85.5 fl (ref 78.0–100.0)
Monocytes Absolute: 0.7 10*3/uL (ref 0.1–1.0)
Monocytes Relative: 13.2 % — ABNORMAL HIGH (ref 3.0–12.0)
Neutro Abs: 2.7 10*3/uL (ref 1.4–7.7)
Neutrophils Relative %: 49.5 % (ref 43.0–77.0)
Platelets: 284 10*3/uL (ref 150.0–400.0)
RBC: 4.92 Mil/uL (ref 3.87–5.11)
RDW: 14 % (ref 11.5–15.5)
WBC: 5.5 10*3/uL (ref 4.0–10.5)

## 2022-06-23 LAB — TSH: TSH: 1.46 u[IU]/mL (ref 0.35–5.50)

## 2022-07-09 DIAGNOSIS — M85852 Other specified disorders of bone density and structure, left thigh: Secondary | ICD-10-CM | POA: Diagnosis not present

## 2022-07-09 DIAGNOSIS — Z13820 Encounter for screening for osteoporosis: Secondary | ICD-10-CM | POA: Diagnosis not present

## 2022-07-09 DIAGNOSIS — M8588 Other specified disorders of bone density and structure, other site: Secondary | ICD-10-CM | POA: Diagnosis not present

## 2022-09-09 ENCOUNTER — Telehealth: Payer: Self-pay | Admitting: Family Medicine

## 2022-09-09 DIAGNOSIS — M2550 Pain in unspecified joint: Secondary | ICD-10-CM

## 2022-09-09 NOTE — Telephone Encounter (Signed)
Patient called to request referral to [facility] Rhe[facility]. Patient said she has mentioned it to Dr. Etter Sjogren during her physicals. She would like to know if this can be done or not. Phone or mychart okay to relay message.

## 2022-09-10 NOTE — Telephone Encounter (Signed)
Please advise 

## 2022-09-15 NOTE — Telephone Encounter (Signed)
Left message on machine to call back with reason for referral.

## 2022-09-16 NOTE — Addendum Note (Signed)
Addended by: Kem Boroughs D on: 09/16/2022 03:50 PM   Modules accepted: Orders

## 2022-09-16 NOTE — Telephone Encounter (Signed)
Patient called and stated that she is experiencing symptoms of rheumatoid arthritis and dermatologist thinks she has alopecia with deeper root cause.  I asked if she was having joint and patient stated that she discussed before for with you but yes she is.  Do you want to use joint pain?

## 2022-09-16 NOTE — Telephone Encounter (Signed)
Referral placed.

## 2022-10-01 ENCOUNTER — Encounter: Payer: Self-pay | Admitting: Family Medicine

## 2022-10-03 DIAGNOSIS — S90851A Superficial foreign body, right foot, initial encounter: Secondary | ICD-10-CM | POA: Diagnosis not present

## 2022-10-13 ENCOUNTER — Encounter: Payer: Self-pay | Admitting: Internal Medicine

## 2022-10-30 NOTE — Progress Notes (Unsigned)
Office Visit Note  Patient: Savannah Deleon [facility]             Date of Birth: [birth year]           MRN: [id number]             PCP: Ann Held, DO Referring: Ann Held, * Visit Date: 11/12/2022 Occupation: @GUAROCC$ @  Subjective:  Pain in multiple joints   History of Present Illness: Savannah Deleon is a 62 y.o. female who presents today for a new patient consultation as requested by her PCP, Dr. Etter Sjogren.    Activities of Daily Living:  Patient reports morning stiffness for *** {minute/hour:19697}.   Patient {ACTIONS;DENIES/REPORTS:21021675::"Denies"} nocturnal pain.  Difficulty dressing/grooming: {ACTIONS;DENIES/REPORTS:21021675::"Denies"} Difficulty climbing stairs: {ACTIONS;DENIES/REPORTS:21021675::"Denies"} Difficulty getting out of chair: {ACTIONS;DENIES/REPORTS:21021675::"Denies"} Difficulty using hands for taps, buttons, cutlery, and/or writing: {ACTIONS;DENIES/REPORTS:21021675::"Denies"}  No Rheumatology ROS completed.   PMFS History:  Patient Active Problem List   Diagnosis Date Noted   Family history of ovarian cancer 05/27/2022   Family history of colon cancer 05/27/2022   History of colonic polyps 05/27/2022   Preventative health care 06/07/2017   HTN (hypertension) 09/26/2013    Past Medical History:  Diagnosis Date   Allergy    Anemia    Asthma    Cataract    left eye    Colon polyps    Environmental allergies    Family history of colon cancer    Family history of ovarian cancer    Heart murmur    Hypertension     Family History  Problem Relation Age of Onset   Ovarian cancer Mother 18   Hypertension Mother    Colon cancer Father 59   Heart disease Sister 72   Heart failure Maternal Grandfather 54   Colon cancer Paternal Grandmother        dx 34s, d. 89   Pancreatic cancer Other        paternal grandmother's paternal aunt   Colon polyps Neg Hx    Esophageal cancer Neg Hx    Rectal cancer Neg Hx    Stomach cancer Neg Hx     Past Surgical History:  Procedure Laterality Date   CATARACT EXTRACTION Left    CYSTECTOMY  09/29/2011   Removed form the Back   RETINAL DETACHMENT SURGERY Left 07/2020   dr Corene Cornea sanders   RETINAL DETACHMENT SURGERY Left 12/2020   sanders   Social History   Social History Narrative   Exercise--limited,     Immunization History  Administered Date(s) Administered   Influenza,inj,Quad PF,6+ Mos 06/13/2019, 06/18/2020   PFIZER(Purple Top)SARS-COV-2 Vaccination 07/01/2020   Tdap 02/12/2012, 06/22/2022   Zoster Recombinat (Shingrix) 06/09/2018, 08/12/2018     Objective: Vital Signs: LMP 06/28/2013    Physical Exam Vitals and nursing note reviewed.  Constitutional:      Appearance: She is well-developed.  HENT:     Head: Normocephalic and atraumatic.  Eyes:     Conjunctiva/sclera: Conjunctivae normal.  Cardiovascular:     Rate and Rhythm: Normal rate and regular rhythm.     Heart sounds: Normal heart sounds.  Pulmonary:     Effort: Pulmonary effort is normal.     Breath sounds: Normal breath sounds.  Abdominal:     General: Bowel sounds are normal.     Palpations: Abdomen is soft.  Musculoskeletal:     Cervical back: Normal range of motion.  Skin:    General: Skin is warm and dry.  Capillary Refill: Capillary refill takes less than 2 seconds.  Neurological:     Mental Status: She is alert and oriented to person, place, and time.  Psychiatric:        Behavior: Behavior normal.      Musculoskeletal Exam: ***  CDAI Exam: CDAI Score: -- Patient Global: --; Provider Global: -- Swollen: --; Tender: -- Joint Exam 11/12/2022   No joint exam has been documented for this visit   There is currently no information documented on the homunculus. Go to the Rheumatology activity and complete the homunculus joint exam.  Investigation: No additional findings.  Imaging: No results found.  Recent Labs: Lab Results  Component Value Date   WBC 5.5 06/22/2022    HGB 13.7 06/22/2022   PLT 284.0 06/22/2022   NA 135 06/22/2022   K 3.7 06/22/2022   CL 98 06/22/2022   CO2 30 06/22/2022   GLUCOSE 82 06/22/2022   BUN 9 06/22/2022   CREATININE 0.72 06/22/2022   BILITOT 0.8 06/22/2022   ALKPHOS 59 06/22/2022   AST 23 06/22/2022   ALT 23 06/22/2022   PROT 8.0 06/22/2022   ALBUMIN 4.6 06/22/2022   CALCIUM 9.9 06/22/2022    Speciality Comments: No specialty comments available.  Procedures:  No procedures performed Allergies: Elemental sulfur, Quinoa-kale-hemp [alimentum], and Wheat bran   Assessment / Plan:     Visit Diagnoses: Polyarthralgia  Primary hypertension  Family history of colon cancer  Family history of ovarian cancer  History of colonic polyps  Orders: No orders of the defined types were placed in this encounter.  No orders of the defined types were placed in this encounter.   Face-to-face time spent with patient was *** minutes. Greater than 50% of time was spent in counseling and coordination of care.  Follow-Up Instructions: No follow-ups on file.   Ofilia Neas, PA-C  Note - This record has been created using Dragon software.  Chart creation errors have been sought, but may not always  have been located. Such creation errors do not reflect on  the standard of medical care.,

## 2022-11-12 ENCOUNTER — Ambulatory Visit (INDEPENDENT_AMBULATORY_CARE_PROVIDER_SITE_OTHER): Payer: Federal, State, Local not specified - PPO

## 2022-11-12 ENCOUNTER — Ambulatory Visit: Payer: Federal, State, Local not specified - PPO | Attending: Physician Assistant | Admitting: Physician Assistant

## 2022-11-12 ENCOUNTER — Ambulatory Visit: Payer: Federal, State, Local not specified - PPO

## 2022-11-12 ENCOUNTER — Encounter: Payer: Self-pay | Admitting: Physician Assistant

## 2022-11-12 VITALS — BP 121/72 | HR 69 | Resp 16 | Ht 66.5 in | Wt 149.8 lb

## 2022-11-12 DIAGNOSIS — I10 Essential (primary) hypertension: Secondary | ICD-10-CM | POA: Diagnosis not present

## 2022-11-12 DIAGNOSIS — R011 Cardiac murmur, unspecified: Secondary | ICD-10-CM | POA: Diagnosis not present

## 2022-11-12 DIAGNOSIS — M67449 Ganglion, unspecified hand: Secondary | ICD-10-CM

## 2022-11-12 DIAGNOSIS — Z8709 Personal history of other diseases of the respiratory system: Secondary | ICD-10-CM

## 2022-11-12 DIAGNOSIS — M79641 Pain in right hand: Secondary | ICD-10-CM

## 2022-11-12 DIAGNOSIS — Z86010 Personal history of colonic polyps: Secondary | ICD-10-CM

## 2022-11-12 DIAGNOSIS — M79672 Pain in left foot: Secondary | ICD-10-CM

## 2022-11-12 DIAGNOSIS — Z8041 Family history of malignant neoplasm of ovary: Secondary | ICD-10-CM

## 2022-11-12 DIAGNOSIS — M79642 Pain in left hand: Secondary | ICD-10-CM | POA: Diagnosis not present

## 2022-11-12 DIAGNOSIS — M79671 Pain in right foot: Secondary | ICD-10-CM

## 2022-11-12 DIAGNOSIS — L659 Nonscarring hair loss, unspecified: Secondary | ICD-10-CM | POA: Diagnosis not present

## 2022-11-12 DIAGNOSIS — M2550 Pain in unspecified joint: Secondary | ICD-10-CM

## 2022-11-12 DIAGNOSIS — Z800 Family history of malignant neoplasm of digestive organs: Secondary | ICD-10-CM

## 2022-11-12 DIAGNOSIS — L568 Other specified acute skin changes due to ultraviolet radiation: Secondary | ICD-10-CM

## 2022-11-16 NOTE — Progress Notes (Signed)
Labs will be discussed at new patient follow up visit.

## 2022-11-17 LAB — CBC WITH DIFFERENTIAL/PLATELET
Absolute Monocytes: 714 cells/uL (ref 200–950)
Basophils Absolute: 42 cells/uL (ref 0–200)
Basophils Relative: 0.7 %
Eosinophils Absolute: 204 cells/uL (ref 15–500)
Eosinophils Relative: 3.4 %
HCT: 44.5 % (ref 35.0–45.0)
Hemoglobin: 14.6 g/dL (ref 11.7–15.5)
Lymphs Abs: 1824 cells/uL (ref 850–3900)
MCH: 27.8 pg (ref 27.0–33.0)
MCHC: 32.8 g/dL (ref 32.0–36.0)
MCV: 84.6 fL (ref 80.0–100.0)
MPV: 9.9 fL (ref 7.5–12.5)
Monocytes Relative: 11.9 %
Neutro Abs: 3216 cells/uL (ref 1500–7800)
Neutrophils Relative %: 53.6 %
Platelets: 353 10*3/uL (ref 140–400)
RBC: 5.26 10*6/uL — ABNORMAL HIGH (ref 3.80–5.10)
RDW: 13.1 % (ref 11.0–15.0)
Total Lymphocyte: 30.4 %
WBC: 6 10*3/uL (ref 3.8–10.8)

## 2022-11-17 LAB — URINALYSIS, ROUTINE W REFLEX MICROSCOPIC
Bilirubin Urine: NEGATIVE
Glucose, UA: NEGATIVE
Hgb urine dipstick: NEGATIVE
Ketones, ur: NEGATIVE
Leukocytes,Ua: NEGATIVE
Nitrite: NEGATIVE
Protein, ur: NEGATIVE
Specific Gravity, Urine: 1.003 (ref 1.001–1.035)
pH: 7.5 (ref 5.0–8.0)

## 2022-11-17 LAB — SJOGRENS SYNDROME-A EXTRACTABLE NUCLEAR ANTIBODY: SSA (Ro) (ENA) Antibody, IgG: <1 AI

## 2022-11-17 LAB — COMPLETE METABOLIC PANEL WITH GFR
AG Ratio: 1.3 (calc) (ref 1.0–2.5)
ALT: 16 U/L (ref 6–29)
AST: 19 U/L (ref 10–35)
Albumin: 4.5 g/dL (ref 3.6–5.1)
Alkaline phosphatase (APISO): 56 U/L (ref 37–153)
BUN: 15 mg/dL (ref 7–25)
CO2: 29 mmol/L (ref 20–32)
Calcium: 10.2 mg/dL (ref 8.6–10.4)
Chloride: 100 mmol/L (ref 98–110)
Creat: 0.75 mg/dL (ref 0.50–1.05)
Globulin: 3.4 g/dL (calc) (ref 1.9–3.7)
Glucose, Bld: 81 mg/dL (ref 65–99)
Potassium: 4.4 mmol/L (ref 3.5–5.3)
Sodium: 139 mmol/L (ref 135–146)
Total Bilirubin: 0.6 mg/dL (ref 0.2–1.2)
Total Protein: 7.9 g/dL (ref 6.1–8.1)
eGFR: 89 mL/min/{1.73_m2} (ref >=60)

## 2022-11-17 LAB — ANTI-DNA ANTIBODY, DOUBLE-STRANDED: ds DNA Ab: 1 IU/mL

## 2022-11-17 LAB — ANTI-NUCLEAR AB-TITER (ANA TITER)
ANA TITER: 1:40 {titer} — ABNORMAL HIGH
ANA Titer 1: 1:80 {titer} — ABNORMAL HIGH

## 2022-11-17 LAB — ANTI-SCLERODERMA ANTIBODY: Scleroderma (Scl-70) (ENA) Antibody, IgG: <1 AI

## 2022-11-17 LAB — ANTI-SMITH ANTIBODY: ENA SM Ab Ser-aCnc: <1 AI

## 2022-11-17 LAB — RNP ANTIBODY: Ribonucleic Protein(ENA) Antibody, IgG: <1 AI

## 2022-11-17 LAB — C3 AND C4
C3 Complement: 153 mg/dL (ref 83–193)
C4 Complement: 39 mg/dL (ref 15–57)

## 2022-11-17 LAB — CYCLIC CITRUL PEPTIDE ANTIBODY, IGG: Cyclic Citrullin Peptide Ab: <16 UNITS

## 2022-11-17 LAB — ANA: Anti Nuclear Antibody (ANA): POSITIVE — AB

## 2022-11-17 LAB — SJOGRENS SYNDROME-B EXTRACTABLE NUCLEAR ANTIBODY: SSB (La) (ENA) Antibody, IgG: <1 AI

## 2022-11-17 LAB — RHEUMATOID FACTOR: Rheumatoid fact SerPl-aCnc: <14 IU/mL (ref <14)

## 2022-11-17 LAB — SEDIMENTATION RATE: Sed Rate: 14 mm/h (ref 0–30)

## 2022-11-17 LAB — C-REACTIVE PROTEIN: CRP: 0.6 mg/L (ref <8.0)

## 2022-11-19 NOTE — Progress Notes (Signed)
Office Visit Note  Patient: Savannah [facility]             Date of Birth: 1961-01-28           MRN: [id number]             PCP: Ann Held, DO Referring: Ann Held, * Visit Date: 12/03/2022 Occupation: '@GUAROCC'$ @  Subjective:  Discuss results   History of Present Illness: Savannah Deleon is a 62 y.o. female with past medical history of osteoarthritis.  Patient presents today to discuss lab results and x-ray results from her initial office visit on 11/12/2022.  She continues to experience intermittent discomfort in both hands but denies any joint swelling.  She has not had any morning stiffness or nocturnal pain.  She denies any difficulty with ADLs recently.  She denies any new concerns since her initial office visit.    Activities of Daily Living:  Patient reports morning stiffness for 0 minutes.   Patient Denies nocturnal pain.  Difficulty dressing/grooming: Denies Difficulty climbing stairs: Denies Difficulty getting out of chair: Denies Difficulty using hands for taps, buttons, cutlery, and/or writing: Denies  Review of Systems  Constitutional: Negative.  Negative for fatigue.  HENT: Negative.  Negative for mouth sores and mouth dryness.   Eyes: Negative.  Negative for dryness.  Respiratory: Negative.  Negative for shortness of breath.   Cardiovascular: Negative.  Negative for chest pain and palpitations.  Gastrointestinal: Negative.  Negative for blood in stool, constipation and diarrhea.  Endocrine: Negative.  Negative for increased urination.  Genitourinary: Negative.  Negative for involuntary urination.  Musculoskeletal:  Positive for joint swelling. Negative for joint pain, gait problem, joint pain, myalgias, muscle weakness, morning stiffness, muscle tenderness and myalgias.  Skin: Negative.  Negative for color change, rash, hair loss and sensitivity to sunlight.  Allergic/Immunologic: Negative.  Negative for susceptible to infections.   Neurological: Negative.  Negative for dizziness and headaches.  Hematological: Negative.  Negative for swollen glands.  Psychiatric/Behavioral: Negative.  Negative for depressed mood and sleep disturbance. The patient is not nervous/anxious.     PMFS History:  Patient Active Problem List   Diagnosis Date Noted   Family history of ovarian cancer 05/27/2022   Family history of colon cancer 05/27/2022   History of colonic polyps 05/27/2022   Preventative health care 06/07/2017   HTN (hypertension) 09/26/2013    Past Medical History:  Diagnosis Date   Allergy    Alopecia    Anemia    Asthma    Cataract    left eye    Colon polyps    Environmental allergies    Family history of colon cancer    Family history of ovarian cancer    Heart murmur    Hypertension     Family History  Problem Relation Age of Onset   Ovarian cancer Mother 60   Hypertension Mother    Colon cancer Father 71   Heart disease Sister 63   Heart failure Maternal Grandfather 30   Colon cancer Paternal Grandmother        dx 57s, d. 48   Pancreatic cancer Other        paternal grandmother's paternal aunt   Colon polyps Neg Hx    Esophageal cancer Neg Hx    Rectal cancer Neg Hx    Stomach cancer Neg Hx    Past Surgical History:  Procedure Laterality Date   CATARACT EXTRACTION Left    CYSTECTOMY  09/29/2011   Removed form the Back   RETINAL DETACHMENT SURGERY Left 07/2020   dr Corene Cornea sanders   RETINAL DETACHMENT SURGERY Left 12/2020   sanders   Social History   Social History Narrative   Exercise--limited,     Immunization History  Administered Date(s) Administered   Influenza,inj,Quad PF,6+ Mos 06/13/2019, 06/18/2020   PFIZER(Purple Top)SARS-COV-2 Vaccination 07/01/2020   Tdap 02/12/2012, 06/22/2022   Zoster Recombinat (Shingrix) 06/09/2018, 08/12/2018     Objective: Vital Signs: BP 125/74 (BP Location: Left Arm, Patient Position: Sitting, Cuff Size: Normal)   Pulse 86   Resp 16   Ht  5' 6.5" (1.689 m)   Wt 148 lb (67.1 kg)   LMP 06/28/2013   BMI 23.53 kg/m    Physical Exam Vitals and nursing note reviewed.  Constitutional:      Appearance: She is well-developed.  HENT:     Head: Normocephalic and atraumatic.  Eyes:     Conjunctiva/sclera: Conjunctivae normal.  Cardiovascular:     Rate and Rhythm: Normal rate and regular rhythm.     Heart sounds: Normal heart sounds.  Pulmonary:     Effort: Pulmonary effort is normal.     Breath sounds: Normal breath sounds.  Abdominal:     General: Bowel sounds are normal.     Palpations: Abdomen is soft.  Musculoskeletal:     Cervical back: Normal range of motion.  Skin:    General: Skin is warm and dry.     Capillary Refill: Capillary refill takes less than 2 seconds.  Neurological:     Mental Status: She is alert and oriented to person, place, and time.  Psychiatric:        Behavior: Behavior normal.      Musculoskeletal Exam: C-spine, thoracic spine, and lumbar spine have good range of motion.  No midline spinal tenderness or SI joint tenderness.  Shoulder joints, elbow joints, wrist joints, MCPs, PIPs, DIPs have good range of motion with no synovitis.  DIP prominence consistent with early osteoarthritic changes in both hands.  Mucinous cyst noted over the right middle DIP.  Complete fist formation bilaterally.  Hip joints have good range of motion with no groin pain.  Knee joints have good range of motion with no warmth or effusion.  Ankle joints have good range of motion with no tenderness or joint swelling.  PIP and DIP thickening consistent with osteoarthritis of both feet.  No tenderness or synovitis over MTP joints.  CDAI Exam: CDAI Score: -- Patient Global: --; Provider Global: -- Swollen: --; Tender: -- Joint Exam 12/03/2022   No joint exam has been documented for this visit   There is currently no information documented on the homunculus. Go to the Rheumatology activity and complete the homunculus joint  exam.  Investigation: No additional findings.  Imaging: XR Foot 2 Views Left  Result Date: 11/12/2022 PIP and DIP narrowing was noted.  No MTP, intertarsal, tibiotalar or subtalar joint space narrowing was noted.  No erosive changes were noted. Impression: These findings are consistent with osteoarthritis of the foot.  XR Foot 2 Views Right  Result Date: 11/12/2022 PIP and DIP narrowing was noted.  No MTP, intertarsal, tibiotalar or subtalar joint space narrowing was noted.  No erosive changes were noted. Impression: These findings are consistent with osteoarthritis of the foot.  XR Hand 2 View Left  Result Date: 11/12/2022 Severe CMC narrowing was noted.  PIP and DIP narrowing was noted.  No MCP, intercarpal or radiocarpal joint space  narrowing was noted.  No erosive changes were noted. Impression: These findings are consistent with osteoarthritis of the hand.  XR Hand 2 View Right  Result Date: 11/12/2022 Severe CMC narrowing was noted.  PIP and DIP narrowing was noted.  No MCP, intercarpal or radiocarpal joint space narrowing was noted.  No erosive changes were noted. Impression: These findings are consistent with osteoarthritis of the hand.   Recent Labs: Lab Results  Component Value Date   WBC 6.0 11/12/2022   HGB 14.6 11/12/2022   PLT 353 11/12/2022   NA 139 11/12/2022   K 4.4 11/12/2022   CL 100 11/12/2022   CO2 29 11/12/2022   GLUCOSE 81 11/12/2022   BUN 15 11/12/2022   CREATININE 0.75 11/12/2022   BILITOT 0.6 11/12/2022   ALKPHOS 59 06/22/2022   AST 19 11/12/2022   ALT 16 11/12/2022   PROT 7.9 11/12/2022   ALBUMIN 4.6 06/22/2022   CALCIUM 10.2 11/12/2022    Speciality Comments: No specialty comments available.  Procedures:  No procedures performed Allergies: Aloe, Elemental sulfur, Quinoa-kale-hemp [alimentum], and Wheat   Assessment / Plan:     Visit Diagnoses: Primary osteoarthritis of both hands -x-rays of both hands from 11/12/2022 are consistent with  osteoarthritis.  CMC, PIP, and DIP joint space narrowing was noted.  No erosive changes.  Discussed that clinical and radiographic findings are consistent with osteoarthritis.  She had no synovitis on examination today.  Rheumatoid factor and anti-CCP were negative on 11/12/2022.  ESR and CRP were within normal limits.  Discussed the diagnosis of osteoarthritis today in detail.  Different treatment options were discussed.  Patient was given a handout of natural anti-inflammatories to start taking.  In the past she cannot tolerate taking turmeric but plans on trying ginger, tart cherry, and omega-3.  Discussed the importance of joint protection and muscle strengthening.  She was given a handout of hand exercises to perform.  Several of these exercises were demonstrated today in the office.  I also discussed the use of arthritis compression gloves.  She was given a jar gripper to assist her while at home. She was advised to notify us if she develops any increased joint pain or joint swelling.  She will follow-up in the office in 6 months or sooner if needed.  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  Evaluated by orthopedist in the past.  Not currently painful or bothersome.  No intervention planned at this time.     Primary osteoarthritis of both feet - X-rays of both feet on 11/12/2022 were consistent with osteoarthritis.  No erosive changes.  On examination she has PIP and DIP thickening consistent with osteoarthritis of both feet.  No tenderness or synovitis over the MTP joints.  Good range of motion of both ankle joints with no tenderness or synovitis.  Discussed the importance of wearing proper fitting shoes.  She was advised to notify us if she develops increased joint pain or joint swelling.  Positive ANA (antinuclear antibody) - 11/12/22: ANA 1:80NS, 1:40 cytoplasmic, dsDNA-, complements WNL, ESR WNL, smith-, Ro-, La-, RF-, CRP WNL, RNP-, Scl-70-: Lab results were discussed today in detail.   All questions were addressed.  She has no clinical features of systemic lupus at this time.  Plan on repeating ANA and ENA in 6 months or sooner if she develops any new or worsening symptoms.  Discussed signs and symptoms to monitor for closely.  Other medical conditions are listed as follows:  Alopecia - Diagnosed  by dermatology in the past. Patchy alopecia noted on scalp.  She has tried several topical agents- no improvement.  Unknown underlying etiology of alopecia.  Photosensitivity - Long-standing history of photosensitivity.  Primary hypertension: Blood pressure is 125/74 today in the office.  Heart murmur  History of asthma  History of colonic polyps  Family history of colon cancer  Family history of ovarian cancer  Orders: No orders of the defined types were placed in this encounter.  No orders of the defined types were placed in this encounter.     Follow-Up Instructions: Return in about 6 months (around 06/05/2023).   Ofilia Neas, PA-C  Note - This record has been created using Dragon software.  Chart creation errors have been sought, but may not always  have been located. Such creation errors do not reflect on  the standard of medical care.

## 2022-12-03 ENCOUNTER — Encounter: Payer: Self-pay | Admitting: Physician Assistant

## 2022-12-03 ENCOUNTER — Ambulatory Visit: Payer: Federal, State, Local not specified - PPO | Attending: Physician Assistant | Admitting: Physician Assistant

## 2022-12-03 VITALS — BP 125/74 | HR 86 | Resp 16 | Ht 66.5 in | Wt 148.0 lb

## 2022-12-03 DIAGNOSIS — M19071 Primary osteoarthritis, right ankle and foot: Secondary | ICD-10-CM

## 2022-12-03 DIAGNOSIS — R768 Other specified abnormal immunological findings in serum: Secondary | ICD-10-CM | POA: Diagnosis not present

## 2022-12-03 DIAGNOSIS — M19041 Primary osteoarthritis, right hand: Secondary | ICD-10-CM

## 2022-12-03 DIAGNOSIS — Z800 Family history of malignant neoplasm of digestive organs: Secondary | ICD-10-CM

## 2022-12-03 DIAGNOSIS — Z86010 Personal history of colonic polyps: Secondary | ICD-10-CM

## 2022-12-03 DIAGNOSIS — R011 Cardiac murmur, unspecified: Secondary | ICD-10-CM

## 2022-12-03 DIAGNOSIS — Z8709 Personal history of other diseases of the respiratory system: Secondary | ICD-10-CM

## 2022-12-03 DIAGNOSIS — M19042 Primary osteoarthritis, left hand: Secondary | ICD-10-CM

## 2022-12-03 DIAGNOSIS — M67449 Ganglion, unspecified hand: Secondary | ICD-10-CM

## 2022-12-03 DIAGNOSIS — L568 Other specified acute skin changes due to ultraviolet radiation: Secondary | ICD-10-CM

## 2022-12-03 DIAGNOSIS — L659 Nonscarring hair loss, unspecified: Secondary | ICD-10-CM

## 2022-12-03 DIAGNOSIS — Z8041 Family history of malignant neoplasm of ovary: Secondary | ICD-10-CM

## 2022-12-03 DIAGNOSIS — I10 Essential (primary) hypertension: Secondary | ICD-10-CM

## 2022-12-03 DIAGNOSIS — M19072 Primary osteoarthritis, left ankle and foot: Secondary | ICD-10-CM

## 2022-12-03 NOTE — Patient Instructions (Signed)
Hand Exercises Hand exercises can be helpful for almost anyone. These exercises can strengthen the hands, improve flexibility and movement, and increase blood flow to the hands. These results can make work and daily tasks easier. Hand exercises can be especially helpful for people who have joint pain from arthritis or have nerve damage from overuse (carpal tunnel syndrome). These exercises can also help people who have injured a hand. Exercises Most of these hand exercises are gentle stretching and motion exercises. It is usually safe to do them often throughout the day. Warming up your hands before exercise may help to reduce stiffness. You can do this with gentle massage or by placing your hands in warm water for 10-15 minutes. It is normal to feel some stretching, pulling, tightness, or mild discomfort as you begin new exercises. This will gradually improve. Stop an exercise right away if you feel sudden, severe pain or your pain gets worse. Ask your health care provider which exercises are best for you. Knuckle bend or "claw" fist  Stand or sit with your arm, hand, and all five fingers pointed straight up. Make sure to keep your wrist straight during the exercise. Gently bend your fingers down toward your palm until the tips of your fingers are touching the top of your palm. Keep your big knuckle straight and just bend the small knuckles in your fingers. Hold this position for __________ seconds. Straighten (extend) your fingers back to the starting position. Repeat this exercise 5-10 times with each hand. Full finger fist  Stand or sit with your arm, hand, and all five fingers pointed straight up. Make sure to keep your wrist straight during the exercise. Gently bend your fingers into your palm until the tips of your fingers are touching the middle of your palm. Hold this position for __________ seconds. Extend your fingers back to the starting position, stretching every joint fully. Repeat  this exercise 5-10 times with each hand. Straight fist Stand or sit with your arm, hand, and all five fingers pointed straight up. Make sure to keep your wrist straight during the exercise. Gently bend your fingers at the big knuckle, where your fingers meet your hand, and the middle knuckle. Keep the knuckle at the tips of your fingers straight and try to touch the bottom of your palm. Hold this position for __________ seconds. Extend your fingers back to the starting position, stretching every joint fully. Repeat this exercise 5-10 times with each hand. Tabletop  Stand or sit with your arm, hand, and all five fingers pointed straight up. Make sure to keep your wrist straight during the exercise. Gently bend your fingers at the big knuckle, where your fingers meet your hand, as far down as you can while keeping the small knuckles in your fingers straight. Think of forming a tabletop with your fingers. Hold this position for __________ seconds. Extend your fingers back to the starting position, stretching every joint fully. Repeat this exercise 5-10 times with each hand. Finger spread  Place your hand flat on a table with your palm facing down. Make sure your wrist stays straight as you do this exercise. Spread your fingers and thumb apart from each other as far as you can until you feel a gentle stretch. Hold this position for __________ seconds. Bring your fingers and thumb tight together again. Hold this position for __________ seconds. Repeat this exercise 5-10 times with each hand. Making circles  Stand or sit with your arm, hand, and all five fingers pointed   straight up. Make sure to keep your wrist straight during the exercise. Make a circle by touching the tip of your thumb to the tip of your index finger. Hold for __________ seconds. Then open your hand wide. Repeat this motion with your thumb and each finger on your hand. Repeat this exercise 5-10 times with each hand. Thumb  motion  Sit with your forearm resting on a table and your wrist straight. Your thumb should be facing up toward the ceiling. Keep your fingers relaxed as you move your thumb. Lift your thumb up as high as you can toward the ceiling. Hold for __________ seconds. Bend your thumb across your palm as far as you can, reaching the tip of your thumb for the small finger (pinkie) side of your palm. Hold for __________ seconds. Repeat this exercise 5-10 times with each hand. Grip strengthening  Hold a stress ball or other soft ball in the middle of your hand. Slowly increase the pressure, squeezing the ball as much as you can without causing pain. Think of bringing the tips of your fingers into the middle of your palm. All of your finger joints should bend when doing this exercise. Hold your squeeze for __________ seconds, then relax. Repeat this exercise 5-10 times with each hand. Contact a health care provider if: Your hand pain or discomfort gets much worse when you do an exercise. Your hand pain or discomfort does not improve within 2 hours after you exercise. If you have any of these problems, stop doing these exercises right away. Do not do them again unless your health care provider says that you can. Get help right away if: You develop sudden, severe hand pain or swelling. If this happens, stop doing these exercises right away. Do not do them again unless your health care provider says that you can. This information is not intended to replace advice given to you by your health care provider. Make sure you discuss any questions you have with your health care provider. Document Revised: 12/26/2020 Document Reviewed: 01/02/2021 [employer] Patient Education  2023 [employer]  

## 2022-12-21 ENCOUNTER — Encounter: Payer: Self-pay | Admitting: Family Medicine

## 2022-12-21 ENCOUNTER — Ambulatory Visit: Payer: Federal, State, Local not specified - PPO | Admitting: Family Medicine

## 2022-12-21 VITALS — BP 120/80 | HR 65 | Temp 98.1°F | Resp 18 | Ht 66.5 in | Wt 149.2 lb

## 2022-12-21 DIAGNOSIS — M19041 Primary osteoarthritis, right hand: Secondary | ICD-10-CM | POA: Diagnosis not present

## 2022-12-21 DIAGNOSIS — I10 Essential (primary) hypertension: Secondary | ICD-10-CM

## 2022-12-21 DIAGNOSIS — Z1211 Encounter for screening for malignant neoplasm of colon: Secondary | ICD-10-CM

## 2022-12-21 DIAGNOSIS — M19042 Primary osteoarthritis, left hand: Secondary | ICD-10-CM | POA: Diagnosis not present

## 2022-12-21 DIAGNOSIS — M1990 Unspecified osteoarthritis, unspecified site: Secondary | ICD-10-CM | POA: Insufficient documentation

## 2022-12-21 HISTORY — DX: Unspecified osteoarthritis, unspecified site: M19.90

## 2022-12-21 LAB — COMPREHENSIVE METABOLIC PANEL
ALT: 13 U/L (ref 0–35)
AST: 18 U/L (ref 0–37)
Albumin: 4.4 g/dL (ref 3.5–5.2)
Alkaline Phosphatase: 55 U/L (ref 39–117)
BUN: 12 mg/dL (ref 6–23)
CO2: 30 mEq/L (ref 19–32)
Calcium: 9.6 mg/dL (ref 8.4–10.5)
Chloride: 97 mEq/L (ref 96–112)
Creatinine, Ser: 0.71 mg/dL (ref 0.40–1.20)
GFR: 90.15 mL/min (ref >60.00)
Glucose, Bld: 76 mg/dL (ref 70–99)
Potassium: 3.8 mEq/L (ref 3.5–5.1)
Sodium: 135 mEq/L (ref 135–145)
Total Bilirubin: 0.8 mg/dL (ref 0.2–1.2)
Total Protein: 7.4 g/dL (ref 6.0–8.3)

## 2022-12-21 LAB — LIPID PANEL
Cholesterol: 232 mg/dL — ABNORMAL HIGH (ref 0–200)
HDL: 74.1 mg/dL (ref >39.00)
LDL Cholesterol: 135 mg/dL — ABNORMAL HIGH (ref 0–99)
NonHDL: 157.91
Total CHOL/HDL Ratio: 3
Triglycerides: 113 mg/dL (ref 0.0–149.0)
VLDL: 22.6 mg/dL (ref 0.0–40.0)

## 2022-12-21 MED ORDER — LISINOPRIL-HYDROCHLOROTHIAZIDE 10-12.5 MG PO TABS: 1.0000 | ORAL_TABLET | Freq: Every day | ORAL | 3 refills | Status: DC

## 2022-12-21 NOTE — Progress Notes (Signed)
Subjective:   By signing my name below, I, Jamey Reas, attest that this documentation has been prepared under the direction and in the presence of Ann Held, DO. 12/21/2022   Patient ID: Savannah Deleon, female    DOB: Oct 31, 1960, 62 y.o.   MRN: [id number]  Chief Complaint  Patient presents with   Hypertension   Follow-up    HPI Patient is in today for a follow-up appointment.    Blood pressure Her blood pressure is stable during this visit. She is requesting a refill on 10-12.5 mg lisinopril-hydrochlorothiazide during this visit.  BP Readings from Last 3 Encounters:  12/21/22 120/80  12/03/22 125/74  11/12/22 121/72    Colonoscopy Last colonoscopy completed on 09/11/2019. Two 3-4 mm polyps and one 1 mm polyp found in the cecum. Four 3-5 mm polyps found in the transverse colon and a 1 mm polyp found in the descending colon. Repeat in 3 years. Referral given during this visit.   Past Medical History:  Diagnosis Date   Allergy    Alopecia    Anemia    Asthma    Cataract    left eye    Colon polyps    Environmental allergies    Family history of colon cancer    Family history of ovarian cancer    Heart murmur    Hypertension     Past Surgical History:  Procedure Laterality Date   CATARACT EXTRACTION Left    CYSTECTOMY  09/29/2011   Removed form the Back   RETINAL DETACHMENT SURGERY Left 07/2020   dr Corene Cornea sanders   RETINAL DETACHMENT SURGERY Left 12/2020   sanders    Family History  Problem Relation Age of Onset   Ovarian cancer Mother 33   Hypertension Mother    Colon cancer Father 50   Heart disease Sister 59   Heart failure Maternal Grandfather 31   Colon cancer Paternal Grandmother        dx 2s, d. 33   Pancreatic cancer Other        paternal grandmother's paternal aunt   Colon polyps Neg Hx    Esophageal cancer Neg Hx    Rectal cancer Neg Hx    Stomach cancer Neg Hx     Social History   Socioeconomic History   Marital  status: Single    Spouse name: Not on file   Number of children: Not on file   Years of education: Not on file   Highest education level: Not on file  Occupational History   Occupation: retired  Tobacco Use   Smoking status: Never    Passive exposure: Past   Smokeless tobacco: Never  Vaping Use   Vaping Use: Never used  Substance and Sexual Activity   Alcohol use: Yes    Comment: Seldom   Drug use: No   Sexual activity: Yes    Partners: Male  Other Topics Concern   Not on file  Social History Narrative   Exercise--limited,     Social Determinants of Health   Financial Resource Strain: Not on file  Food Insecurity: Not on file  Transportation Needs: Not on file  Physical Activity: Not on file  Stress: Not on file  Social Connections: Not on file  Intimate Partner Violence: Not on file    Outpatient Medications Prior to Visit  Medication Sig Dispense Refill   Multiple Vitamins-Minerals (MULTIVITAMIN PO) Take by mouth.     lisinopril-hydrochlorothiazide (ZESTORETIC) 10-12.5 MG tablet  Take 1 tablet by mouth daily. 90 tablet 3   No facility-administered medications prior to visit.    Allergies  Allergen Reactions   Aloe Anaphylaxis    Cannot use topically- redness and swelling, diff breathing   Elemental Sulfur Anaphylaxis    SOB, Swelling, rash   Quinoa-Kale-Hemp [Alimentum]     Rash, swelling   Wheat     Review of Systems  Constitutional:  Negative for fever and malaise/fatigue.  HENT:  Negative for congestion.   Eyes:  Negative for blurred vision.  Respiratory:  Negative for shortness of breath.   Cardiovascular:  Negative for chest pain, palpitations and leg swelling.  Gastrointestinal:  Negative for abdominal pain, blood in stool and nausea.  Genitourinary:  Negative for dysuria and frequency.  Musculoskeletal:  Negative for falls.  Skin:  Negative for rash.  Neurological:  Negative for dizziness, loss of consciousness and headaches.   Endo/Heme/Allergies:  Negative for environmental allergies.  Psychiatric/Behavioral:  Negative for depression. The patient is not nervous/anxious.        Objective:    Physical Exam Vitals and nursing note reviewed.  Constitutional:      General: She is not in acute distress.    Appearance: Normal appearance.  HENT:     Head: Normocephalic and atraumatic.     Right Ear: External ear normal.     Left Ear: External ear normal.  Eyes:     Extraocular Movements: Extraocular movements intact.     Pupils: Pupils are equal, round, and reactive to light.  Cardiovascular:     Rate and Rhythm: Normal rate and regular rhythm.     Heart sounds: Normal heart sounds. No murmur heard.    No gallop.  Pulmonary:     Effort: Pulmonary effort is normal. No respiratory distress.     Breath sounds: Normal breath sounds. No wheezing or rales.  Skin:    General: Skin is warm.  Neurological:     General: No focal deficit present.     Mental Status: She is alert and oriented to person, place, and time.  Psychiatric:        Judgment: Judgment normal.     BP 120/80 (BP Location: Right Arm, Patient Position: Sitting, Cuff Size: Normal)   Pulse 65   Temp 98.1 F (36.7 C) (Oral)   Resp 18   Ht 5' 6.5" (1.689 m)   Wt 149 lb 3.2 oz (67.7 kg)   LMP 06/28/2013   SpO2 98%   BMI 23.72 kg/m  Wt Readings from Last 3 Encounters:  12/21/22 149 lb 3.2 oz (67.7 kg)  12/03/22 148 lb (67.1 kg)  11/12/22 149 lb 12.8 oz (67.9 kg)       Assessment & Plan:  Essential hypertension -     Lisinopril-hydroCHLOROthiazide; Take 1 tablet by mouth daily.  Dispense: 90 tablet; Refill: 3 -     Comprehensive metabolic panel -     Lipid panel -     Comprehensive metabolic panel -     Lipid panel  Colon cancer screening -     Ambulatory referral to Gastroenterology  Primary osteoarthritis of both hands  Primary hypertension Assessment & Plan: Well controlled, no changes to meds. Encouraged heart healthy  diet such as the DASH diet and exercise as tolerated.       IAnn Held, DO, personally preformed the services described in this documentation.  All medical record entries made by the scribe were at my direction and in  my presence.  I have reviewed the chart and discharge instructions (if applicable) and agree that the record reflects my personal performance and is accurate and complete. 12/21/2022  Ann Held, DO   I,Kennedy C Lynn,acting as a scribe for Ann Held, DO.,have documented all relevant documentation on the behalf of Ann Held, DO,as directed by  Ann Held, DO while in the presence of Ann Held, DO.

## 2022-12-21 NOTE — Assessment & Plan Note (Signed)
Well controlled, no changes to meds. Encouraged heart healthy diet such as the DASH diet and exercise as tolerated.  °

## 2023-02-09 ENCOUNTER — Encounter: Payer: Self-pay | Admitting: Internal Medicine

## 2023-03-29 ENCOUNTER — Ambulatory Visit (AMBULATORY_SURGERY_CENTER): Payer: Federal, State, Local not specified - PPO

## 2023-03-29 VITALS — Ht 66.5 in | Wt 148.0 lb

## 2023-03-29 DIAGNOSIS — Z86010 Personal history of colonic polyps: Secondary | ICD-10-CM

## 2023-03-29 DIAGNOSIS — Z800 Family history of malignant neoplasm of digestive organs: Secondary | ICD-10-CM

## 2023-03-29 MED ORDER — PEG 3350-KCL-NA BICARB-NACL 420 G PO SOLR: 4000.0000 mL | Freq: Once | ORAL | 0 refills | Status: AC

## 2023-03-29 NOTE — Progress Notes (Signed)
No egg or soy allergy known to patient  No issues known to pt with past sedation with any surgeries or procedures Patient denies ever being told they had issues or difficulty with intubation  No FH of Malignant Hyperthermia Pt is not on diet pills Pt is not on  home 02  Pt is not on blood thinners  Pt denies issues with constipation  No A fib or A flutter Have any cardiac testing pending-- no  LOA: independent  Prep: Golytely   Patient's chart reviewed by John Nulty CNRA prior to previsit and patient appropriate for the [facility].  Previsit completed and red dot placed by patient's name on their procedure day (on provider's schedule).     PV competed with patient. Prep instructions sent via mychart and home address.  

## 2023-04-06 ENCOUNTER — Other Ambulatory Visit: Payer: Self-pay | Admitting: Family Medicine

## 2023-04-06 DIAGNOSIS — Z1231 Encounter for screening mammogram for malignant neoplasm of breast: Secondary | ICD-10-CM

## 2023-04-26 ENCOUNTER — Encounter: Payer: Federal, State, Local not specified - PPO | Admitting: Internal Medicine

## 2023-05-05 ENCOUNTER — Encounter: Payer: Self-pay | Admitting: Internal Medicine

## 2023-05-05 ENCOUNTER — Ambulatory Visit (AMBULATORY_SURGERY_CENTER): Payer: Federal, State, Local not specified - PPO | Admitting: Internal Medicine

## 2023-05-05 VITALS — BP 117/76 | HR 79 | Temp 97.1°F | Resp 11 | Ht 66.5 in | Wt 148.0 lb

## 2023-05-05 DIAGNOSIS — D122 Benign neoplasm of ascending colon: Secondary | ICD-10-CM | POA: Diagnosis not present

## 2023-05-05 DIAGNOSIS — Z86010 Personal history of colonic polyps: Secondary | ICD-10-CM | POA: Diagnosis not present

## 2023-05-05 DIAGNOSIS — Z1211 Encounter for screening for malignant neoplasm of colon: Secondary | ICD-10-CM | POA: Diagnosis not present

## 2023-05-05 DIAGNOSIS — Z800 Family history of malignant neoplasm of digestive organs: Secondary | ICD-10-CM

## 2023-05-05 DIAGNOSIS — Z09 Encounter for follow-up examination after completed treatment for conditions other than malignant neoplasm: Secondary | ICD-10-CM | POA: Diagnosis not present

## 2023-05-05 DIAGNOSIS — D123 Benign neoplasm of transverse colon: Secondary | ICD-10-CM

## 2023-05-05 DIAGNOSIS — K6389 Other specified diseases of intestine: Secondary | ICD-10-CM | POA: Diagnosis not present

## 2023-05-05 MED ORDER — SODIUM CHLORIDE 0.9 % IV SOLN: 500.0000 mL | Freq: Once | INTRAVENOUS | Status: DC

## 2023-05-05 NOTE — Progress Notes (Signed)
Patient ID: Savannah Deleon [facility], female   DOB: 05/22/1961, 62 y.o.   MRN: [id number]    GASTROENTEROLOGY PROCEDURE H&P NOTE   Primary Care Physician: Donato Schultz, DO    Reason for Procedure:  Personal history of adenomatous colon polyps and family history of colon cancer  Plan:    Colonoscopy  Patient is appropriate for endoscopic procedure(s) in the ambulatory ([facility]) setting.  The nature of the procedure, as well as the risks, benefits, and alternatives were carefully and thoroughly reviewed with the patient. Ample time for discussion and questions allowed. The patient understood, was satisfied, and agreed to proceed.     HPI: Savannah Deleon is a 62 y.o. female who presents for surveillance colonoscopy.  Medical history as below.  Tolerated the prep.  No recent chest pain or shortness of breath.  No abdominal pain today.  Past Medical History:  Diagnosis Date   Allergy    Alopecia    Anemia    Asthma    Cataract    left eye    Colon polyps    Environmental allergies    Family history of colon cancer    Family history of ovarian cancer    Heart murmur    Hypertension     Past Surgical History:  Procedure Laterality Date   CATARACT EXTRACTION Left    CYSTECTOMY  09/29/2011   Removed form the Back   RETINAL DETACHMENT SURGERY Left 07/2020   dr Barbara Cower sanders   RETINAL DETACHMENT SURGERY Left 12/2020   sanders    Prior to Admission medications   Medication Sig Start Date End Date Taking? Authorizing Provider  lisinopril-hydrochlorothiazide (ZESTORETIC) 10-12.5 MG tablet Take 1 tablet by mouth daily. 12/21/22  Yes Donato Schultz, DO  Multiple Vitamins-Minerals (MULTIVITAMIN PO) Take by mouth.    [provider]    Current Outpatient Medications  Medication Sig Dispense Refill   lisinopril-hydrochlorothiazide (ZESTORETIC) 10-12.5 MG tablet Take 1 tablet by mouth daily. 90 tablet 3   Multiple Vitamins-Minerals (MULTIVITAMIN PO) Take by  mouth.     Current Facility-Administered Medications  Medication Dose Route Frequency Provider Last Rate Last Admin   0.9 %  sodium chloride infusion  500 mL Intravenous Once Meigan Pates, Carie Caddy, MD        Allergies as of 05/05/2023 - Review Complete 05/05/2023  Allergen Reaction Noted   Aloe Anaphylaxis 11/12/2022   Elemental sulfur Anaphylaxis 09/22/2013   Quinoa-kale-hemp [alimentum]  09/11/2019   Wheat  09/11/2019    Family History  Problem Relation Age of Onset   Ovarian cancer Mother 29   Hypertension Mother    Colon cancer Father 81   Heart disease Sister 40   Heart failure Maternal Grandfather 17   Colon cancer Paternal Grandmother        dx 48s, d. 28   Pancreatic cancer Other        paternal grandmother's paternal aunt   Colon polyps Neg Hx    Esophageal cancer Neg Hx    Rectal cancer Neg Hx    Stomach cancer Neg Hx     Social History   Socioeconomic History   Marital status: Single    Spouse name: Not on file   Number of children: Not on file   Years of education: Not on file   Highest education level: Not on file  Occupational History   Occupation: retired  Tobacco Use   Smoking status: Never    Passive exposure: Past  Smokeless tobacco: Never  Vaping Use   Vaping status: Never Used  Substance and Sexual Activity   Alcohol use: Yes    Comment: Seldom   Drug use: No   Sexual activity: Yes    Partners: Male  Other Topics Concern   Not on file  Social History Narrative   Exercise--limited,     Social Determinants of Health   Financial Resource Strain: Not on file  Food Insecurity: Not on file  Transportation Needs: Not on file  Physical Activity: Not on file  Stress: Not on file  Social Connections: Unknown (06/11/2022)   Received from [facility]   Social Network    Social Network: Not on file  Intimate Partner Violence: Unknown (06/11/2022)   Received from [facility]   HITS    Physically Hurt: Not on file    Insult or Talk Down To:  Not on file    Threaten Physical Harm: Not on file    Scream or Curse: Not on file    Physical Exam: Vital signs in last 24 hours: @BP  (!) 135/92   Pulse 65   Temp (!) 97.1 F (36.2 C) (Skin)   Ht 5' 6.5" (1.689 m)   Wt 148 lb (67.1 kg)   LMP 06/28/2013   SpO2 98%   BMI 23.53 kg/m  GEN: NAD EYE: Sclerae anicteric ENT: MMM CV: Non-tachycardic Pulm: CTA b/l GI: Soft, NT/ND NEURO:  Alert & Oriented x 3   Erick Blinks, MD [facility]  05/05/2023 1:27 PM

## 2023-05-05 NOTE — Progress Notes (Signed)
Uneventful anesthetic. Report to pacu rn. Vss. Care resumed by rn. 

## 2023-05-05 NOTE — Op Note (Signed)
[facility] Patient Name: Savannah Deleon Procedure Date: 05/05/2023 12:38 PM MRN: [id number] Endoscopist: Beverley Fiedler , MD, [id number] Age: 62 Referring MD:  Date of Birth: April 16, 1961 Gender: Female Account #: [id number] Procedure:                Colonoscopy Indications:              High risk colon cancer surveillance: Personal                            history of multiple adenomas, Family history of                            colon cancer in a first-degree relative (father),                            Last colonoscopy: December 2020 (5 adenomas) Medicines:                Monitored Anesthesia Care Procedure:                Pre-Anesthesia Assessment:                           - Prior to the procedure, a History and Physical                            was performed, and patient medications and                            allergies were reviewed. The patient's tolerance of                            previous anesthesia was also reviewed. The risks                            and benefits of the procedure and the sedation                            options and risks were discussed with the patient.                            All questions were answered, and informed consent                            was obtained. Prior Anticoagulants: The patient has                            taken no anticoagulant or antiplatelet agents. ASA                            Grade Assessment: II - A patient with mild systemic                            disease. After reviewing the risks and benefits,  the patient was deemed in satisfactory condition to                            undergo the procedure.                           After obtaining informed consent, the colonoscope                            was passed under direct vision. Throughout the                            procedure, the patient's blood pressure, pulse, and                            oxygen saturations were  monitored continuously. The                            Olympus CF-HQ190L ([id number]) Colonoscope was                            introduced through the anus and advanced to the                            cecum, identified by the ileocecal valve. The                            colonoscopy was performed without difficulty. The                            patient tolerated the procedure well. The quality                            of the bowel preparation was good. The ileocecal                            valve, appendiceal orifice, and rectum were                            photographed. Scope In: 1:37:42 PM Scope Out: 1:53:52 PM Scope Withdrawal Time: 0 hours 11 minutes 59 seconds  Total Procedure Duration: 0 hours 16 minutes 10 seconds  Findings:                 The digital rectal exam was normal.                           Two sessile polyps were found in the transverse                            colon and ascending colon. The polyps were 2 to 3                            mm in size. These polyps were removed with a cold  snare. Resection and retrieval were complete.                           Multiple small-mouthed diverticula were found in                            the sigmoid colon.                           The exam was otherwise without abnormality on                            direct and retroflexion views. Complications:            No immediate complications. Estimated Blood Loss:     Estimated blood loss: none. Impression:               - Two 2 to 3 mm polyps in the transverse colon (1)                            and in the ascending colon (1), removed with a cold                            snare. Resected and retrieved.                           - Mild diverticulosis in the sigmoid colon.                           - The examination was otherwise normal on direct                            and retroflexion views. Recommendation:           - Patient has a  contact number available for                            emergencies. The signs and symptoms of potential                            delayed complications were discussed with the                            patient. Return to normal activities tomorrow.                            Written discharge instructions were provided to the                            patient.                           - Resume previous diet.                           - Continue present medications.                           -  Await pathology results.                           - Repeat colonoscopy is recommended for                            surveillance. The colonoscopy date will be                            determined after pathology results from today's                            exam become available for review. Beverley Fiedler, MD 05/05/2023 1:58:05 PM This report has been signed electronically.

## 2023-05-05 NOTE — Progress Notes (Signed)
Pt's states no medical or surgical changes since previsit or office visit. VS assessed by C.W 

## 2023-05-05 NOTE — Progress Notes (Signed)
Called to room to assist during endoscopic procedure.  Patient ID and intended procedure confirmed with present staff. Received instructions for my participation in the procedure from the performing physician.  

## 2023-05-05 NOTE — Patient Instructions (Addendum)
Continue present medications. Await pathology results. Repeat colonoscopy is recommended for surveillance. The colonoscopy date will be determined after pathology results from today's exam become available for review.  Please read over handouts about polyps and diverticulosis                                                                            YOU HAD AN ENDOSCOPIC PROCEDURE TODAY AT THE [facility]:   Refer to the procedure report that was given to you for any specific questions about what was found during the examination.  If the procedure report does not answer your questions, please call your gastroenterologist to clarify.  If you requested that your care partner not be given the details of your procedure findings, then the procedure report has been included in a sealed envelope for you to review at your convenience later.  YOU SHOULD EXPECT: Some feelings of bloating in the abdomen. Passage of more gas than usual.  Walking can help get rid of the air that was put into your GI tract during the procedure and reduce the bloating. If you had a lower endoscopy (such as a colonoscopy or flexible sigmoidoscopy) you may notice spotting of blood in your stool or on the toilet paper. If you underwent a bowel prep for your procedure, you may not have a normal bowel movement for a few days.  Please Note:  You might notice some irritation and congestion in your nose or some drainage.  This is from the oxygen used during your procedure.  There is no need for concern and it should clear up in a day or so.  SYMPTOMS TO REPORT IMMEDIATELY:  Following lower endoscopy (colonoscopy or flexible sigmoidoscopy):  Excessive amounts of blood in the stool  Significant tenderness or worsening of abdominal pains  Swelling of the abdomen that is new, acute  Fever of 100F or higher  For urgent or emergent issues, a gastroenterologist can be reached at any hour by calling [phone]. Do not use  MyChart messaging for urgent concerns.    DIET:  We do recommend a small meal at first, but then you may proceed to your regular diet.  Drink plenty of fluids but you should avoid alcoholic beverages for 24 hours.  ACTIVITY:  You should plan to take it easy for the rest of today and you should NOT DRIVE or use heavy machinery until tomorrow (because of the sedation medicines used during the test).    FOLLOW UP: Our staff will call the number listed on your records the next business day following your procedure.  We will call around 7:15- 8:00 am to check on you and address any questions or concerns that you may have regarding the information given to you following your procedure. If we do not reach you, we will leave a message.     If any biopsies were taken you will be contacted by phone or by letter within the next 1-3 weeks.  Please call us at [phone] if you have not heard about the biopsies in 3 weeks.    SIGNATURES/CONFIDENTIALITY: You and/or your care partner have signed paperwork which will be entered into your electronic medical record.  These  signatures attest to the fact that that the information above on your After Visit Summary has been reviewed and is understood.  Full responsibility of the confidentiality of this discharge information lies with you and/or your care-partner.

## 2023-05-06 ENCOUNTER — Telehealth: Payer: Self-pay

## 2023-05-06 NOTE — Telephone Encounter (Signed)
Attempted f/u call. No answer, left VM. 

## 2023-05-10 ENCOUNTER — Encounter: Payer: Self-pay | Admitting: Internal Medicine

## 2023-05-20 ENCOUNTER — Other Ambulatory Visit: Payer: Self-pay

## 2023-05-20 ENCOUNTER — Encounter (HOSPITAL_BASED_OUTPATIENT_CLINIC_OR_DEPARTMENT_OTHER): Payer: Self-pay | Admitting: Urology

## 2023-05-20 ENCOUNTER — Emergency Department (HOSPITAL_BASED_OUTPATIENT_CLINIC_OR_DEPARTMENT_OTHER)
Admission: EM | Admit: 2023-05-20 | Discharge: 2023-05-20 | Disposition: A | Discharge status: Home / Self Care | Payer: Federal, State, Local not specified - PPO | Attending: Emergency Medicine | Admitting: Emergency Medicine

## 2023-05-20 DIAGNOSIS — R42 Dizziness and giddiness: Secondary | ICD-10-CM | POA: Insufficient documentation

## 2023-05-20 DIAGNOSIS — Z79899 Other long term (current) drug therapy: Secondary | ICD-10-CM | POA: Insufficient documentation

## 2023-05-20 DIAGNOSIS — Z1152 Encounter for screening for COVID-19: Secondary | ICD-10-CM | POA: Insufficient documentation

## 2023-05-20 DIAGNOSIS — I10 Essential (primary) hypertension: Secondary | ICD-10-CM | POA: Diagnosis not present

## 2023-05-20 DIAGNOSIS — R112 Nausea with vomiting, unspecified: Secondary | ICD-10-CM | POA: Diagnosis not present

## 2023-05-20 LAB — URINALYSIS, W/ REFLEX TO CULTURE (INFECTION SUSPECTED)
Bilirubin Urine: NEGATIVE
Glucose, UA: NEGATIVE mg/dL
Ketones, ur: NEGATIVE mg/dL
Nitrite: NEGATIVE
Protein, ur: NEGATIVE mg/dL
Specific Gravity, Urine: 1.01 (ref 1.005–1.030)
pH: 6 (ref 5.0–8.0)

## 2023-05-20 LAB — CBC WITH DIFFERENTIAL/PLATELET
Abs Immature Granulocytes: 0.03 10*3/uL (ref 0.00–0.07)
Basophils Absolute: 0 10*3/uL (ref 0.0–0.1)
Basophils Relative: 0 %
Eosinophils Absolute: 0 10*3/uL (ref 0.0–0.5)
Eosinophils Relative: 0 %
HCT: 42.9 % (ref 36.0–46.0)
Hemoglobin: 13.9 g/dL (ref 12.0–15.0)
Immature Granulocytes: 0 %
Lymphocytes Relative: 13 %
Lymphs Abs: 1.1 10*3/uL (ref 0.7–4.0)
MCH: 27.5 pg (ref 26.0–34.0)
MCHC: 32.4 g/dL (ref 30.0–36.0)
MCV: 85 fL (ref 80.0–100.0)
Monocytes Absolute: 0.8 10*3/uL (ref 0.1–1.0)
Monocytes Relative: 9 %
Neutro Abs: 6.4 10*3/uL (ref 1.7–7.7)
Neutrophils Relative %: 78 %
Platelets: 345 10*3/uL (ref 150–400)
RBC: 5.05 MIL/uL (ref 3.87–5.11)
RDW: 14.3 % (ref 11.5–15.5)
WBC: 8.3 10*3/uL (ref 4.0–10.5)
nRBC: 0 % (ref 0.0–0.2)

## 2023-05-20 LAB — RESP PANEL BY RT-PCR (RSV, FLU A&B, COVID)  RVPGX2
Influenza A by PCR: NEGATIVE
Influenza B by PCR: NEGATIVE
Resp Syncytial Virus by PCR: NEGATIVE
SARS Coronavirus 2 by RT PCR: NEGATIVE

## 2023-05-20 LAB — TROPONIN I (HIGH SENSITIVITY): Troponin I (High Sensitivity): 3 ng/L (ref <18)

## 2023-05-20 LAB — BASIC METABOLIC PANEL
Anion gap: 12 (ref 5–15)
BUN: 12 mg/dL (ref 8–23)
CO2: 25 mmol/L (ref 22–32)
Calcium: 9.5 mg/dL (ref 8.9–10.3)
Chloride: 97 mmol/L — ABNORMAL LOW (ref 98–111)
Creatinine, Ser: 0.66 mg/dL (ref 0.44–1.00)
GFR, Estimated: >60 mL/min (ref >60)
Glucose, Bld: 108 mg/dL — ABNORMAL HIGH (ref 70–99)
Potassium: 4.4 mmol/L (ref 3.5–5.1)
Sodium: 134 mmol/L — ABNORMAL LOW (ref 135–145)

## 2023-05-20 LAB — HEPATIC FUNCTION PANEL
ALT: 17 U/L (ref 0–44)
AST: 21 U/L (ref 15–41)
Albumin: 4.4 g/dL (ref 3.5–5.0)
Alkaline Phosphatase: 59 U/L (ref 38–126)
Bilirubin, Direct: 0.1 mg/dL (ref 0.0–0.2)
Indirect Bilirubin: 0.8 mg/dL (ref 0.3–0.9)
Total Bilirubin: 0.9 mg/dL (ref 0.3–1.2)
Total Protein: 8.1 g/dL (ref 6.5–8.1)

## 2023-05-20 MED ORDER — LACTATED RINGERS IV BOLUS
1000.0000 mL | Freq: Once | INTRAVENOUS | Status: AC
  Administered 2023-05-20: 1000 mL via INTRAVENOUS

## 2023-05-20 MED ORDER — ONDANSETRON HCL 4 MG/2ML IJ SOLN
4.0000 mg | Freq: Once | INTRAMUSCULAR | Status: DC
  Filled 2023-05-20: qty 2

## 2023-05-20 MED ORDER — ACETAMINOPHEN 500 MG PO TABS
1000.0000 mg | ORAL_TABLET | Freq: Once | ORAL | Status: AC
  Administered 2023-05-20: 1000 mg via ORAL
  Filled 2023-05-20: qty 2

## 2023-05-20 MED ORDER — ONDANSETRON 4 MG PO TBDP: 4.0000 mg | ORAL_TABLET | Freq: Three times a day (TID) | ORAL | 0 refills | Status: DC | PRN

## 2023-05-20 NOTE — ED Triage Notes (Signed)
Pt states felt lightheaded earlier today, had episode of vomiting x 2.  Denies diarrhea or fever  Reports mild headache

## 2023-05-20 NOTE — Discharge Instructions (Addendum)
Your nausea is most likely related to a viral illness. However, we will refer you to see a Cardiologist to follow-up on your lightheadedness. - Take Zofran dissolving tablets every 8 hours as needed for nausea - Stay hydrated and drink plenty of fluids.  - Please follow-up with your PCP

## 2023-05-20 NOTE — ED Provider Notes (Signed)
[place] [facility] Provider Note   CSN: [id number] Arrival date & time: 05/20/23  1819     History  Chief Complaint  Patient presents with   Emesis   Savannah Deleon is a 62 y.o. female w/ hx of HTN, OA, HLD p/w N/V.   Around 9am, started to feel lightheaded. Tried to drink water, then she vomited around 10-11am. Had BM, but no diarrhea. Feeling nauseous. Then vomited again in afternoon, so decided to come to ED. Now having a dull headache, feels like a "allergy headache" and not a migraine.   No fever, congestion, cough, sore throat, abdominal pain, dysuria, myalgia.   No recent travel or sick contacts. No one at home is sick.      Emesis      Home Medications Prior to Admission medications   Medication Sig Start Date End Date Taking? Authorizing Provider  ondansetron (ZOFRAN-ODT) 4 MG disintegrating tablet Take 1 tablet (4 mg total) by mouth every 8 (eight) hours as needed for nausea or vomiting. 05/20/23  Yes Lincoln Brigham, MD  lisinopril-hydrochlorothiazide (ZESTORETIC) 10-12.5 MG tablet Take 1 tablet by mouth daily. 12/21/22   Donato Schultz, DO  Multiple Vitamins-Minerals (MULTIVITAMIN PO) Take by mouth.    [provider]      Allergies    Aloe, Elemental sulfur, Quinoa-kale-hemp [alimentum], and Wheat    Review of Systems   Review of Systems  Gastrointestinal:  Positive for vomiting.   Physical Exam Updated Vital Signs BP (!) 145/88   Pulse (!) 59   Temp 97.7 F (36.5 C) (Oral)   Resp 15   Ht 5\' 7"  (1.702 m)   Wt 67.1 kg   LMP 06/28/2013   SpO2 99%   BMI 23.17 kg/m  Physical Exam General: Alert, non-toxic appearing pleasant woman. NAD. HEENT: NCAT. MMM. Neck full ROM.  CV: RRR, no murmurs.  Resp: CTAB, no wheezing or crackles. Normal WOB on RA.  Abm: Soft, nontender, nondistended. BS present. Ext: Moves all ext spontaneously Skin: Warm, well perfused   ED Results / Procedures / Treatments    Labs (all labs ordered are listed, but only abnormal results are displayed) Labs Reviewed  BASIC METABOLIC PANEL - Abnormal; Notable for the following components:      Result Value   Sodium 134 (*)    Chloride 97 (*)    Glucose, Bld 108 (*)    All other components within normal limits  URINALYSIS, W/ REFLEX TO CULTURE (INFECTION SUSPECTED) - Abnormal; Notable for the following components:   Hgb urine dipstick TRACE (*)    Leukocytes,Ua SMALL (*)    Bacteria, UA RARE (*)    All other components within normal limits  RESP PANEL BY RT-PCR (RSV, FLU A&B, COVID)  RVPGX2  CBC WITH DIFFERENTIAL/PLATELET  HEPATIC FUNCTION PANEL  TROPONIN I (HIGH SENSITIVITY)  TROPONIN I (HIGH SENSITIVITY)   EKG EKG Interpretation Date/Time:  Thursday May 20 2023 18:36:43 EDT Ventricular Rate:  88 PR Interval:  180 QRS Duration:  96 QT Interval:  455 QTC Calculation: 551 R Axis:   79  Text Interpretation: Sinus rhythm Left atrial enlargement Nonspecific T abnormalities, diffuse leads Prolonged QT interval No previous ECGs available Confirmed by Alvira Monday ([id number]) on 05/20/2023 8:57:34 PM  Radiology No results found.  Procedures Procedures   Medications Ordered in ED Medications  ondansetron [facility]) injection 4 mg (0 mg Intravenous Hold 05/20/23 2034)  lactated ringers bolus 1,000 mL (0 mLs  Intravenous Stopped 05/20/23 2111)  acetaminophen (TYLENOL) tablet 1,000 mg (1,000 mg Oral Given 05/20/23 1957)    ED Course/ Medical Decision Making/ A&P                                Medical Decision Making:   Savannah Deleon is a 62 y.o. female who presented to the ED today with N/V detailed above.     Reviewed and confirmed nursing documentation for past medical history, family history, social history.    Initial Assessment:   With the patient's presentation of N/V, differential includes (but not limited to) gastroenteritis, viral URI, ACS, migraine.   This is most consistent with an  acute complicated illness  Initial Plan:  BMP and hepatic function panel to workup electrolyte abnormalities CBC to workup infectious etiologies Trops to workup ACS Tylenol for headache Zofran for nausea UA for potential UTI 1L LR bolus for dehydration and emesis EKG to evaluate for cardiac pathology Objective evaluation as below reviewed   Initial Study Results:   Laboratory  All laboratory results reviewed without evidence of clinically relevant pathology.   - UA unremarkable - CBC wnl - BMP and hepatic panel unremarkable  EKG EKG was reviewed independently. Rate, rhythm, axis, intervals all examined and without medically relevant abnormality. ST segments without concerns for elevations.    Reassessment and Plan:   - Pt symptoms much improved after zofran and fluids. VSS. Pt is safe for discharge.  - Provided Zofran 4mg  q8h prn for N/V - Discussed strict return precautions - Given lightheadedness and prolonged QT on EKG, will provide referral to Cardiology - Advised to f/u with PCP   Final Clinical Impression(s) / ED Diagnoses Final diagnoses:  Nausea and vomiting, unspecified vomiting type  Lightheadedness    Rx / DC Orders ED Discharge Orders          Ordered    Ambulatory referral to Cardiology        05/20/23 2210    ondansetron (ZOFRAN-ODT) 4 MG disintegrating tablet  Every 8 hours PRN        05/20/23 2211              Lincoln Brigham, MD 05/20/23 2217    Alvira Monday, MD 05/21/23 2353

## 2023-05-26 DIAGNOSIS — D485 Neoplasm of uncertain behavior of skin: Secondary | ICD-10-CM | POA: Diagnosis not present

## 2023-05-26 DIAGNOSIS — C439 Malignant melanoma of skin, unspecified: Secondary | ICD-10-CM | POA: Insufficient documentation

## 2023-05-26 DIAGNOSIS — L814 Other melanin hyperpigmentation: Secondary | ICD-10-CM | POA: Diagnosis not present

## 2023-05-26 DIAGNOSIS — L578 Other skin changes due to chronic exposure to nonionizing radiation: Secondary | ICD-10-CM | POA: Diagnosis not present

## 2023-05-26 DIAGNOSIS — D0361 Melanoma in situ of right upper limb, including shoulder: Secondary | ICD-10-CM | POA: Diagnosis not present

## 2023-05-26 DIAGNOSIS — D225 Melanocytic nevi of trunk: Secondary | ICD-10-CM | POA: Diagnosis not present

## 2023-05-26 DIAGNOSIS — L821 Other seborrheic keratosis: Secondary | ICD-10-CM | POA: Diagnosis not present

## 2023-05-26 HISTORY — DX: Malignant melanoma of skin, unspecified: C43.9

## 2023-06-02 NOTE — Progress Notes (Unsigned)
Office Visit Note  Patient: Savannah Deleon             Date of Birth: [birth year]           MRN: [id number]             PCP: Donato Schultz, DO Referring: Donato Schultz, * Visit Date: 06/16/2023 Occupation: @GUAROCC @  Subjective:  Routine follow up   History of Present Illness: Savannah Deleon is a 62 y.o. female with history of positive ANA and osteoarthritis. Patient reports since her last office visit she was evaluated in the ED on 05/20/23 for nausea and lightheadedness.  Patient reports that she underwent a thorough workup which was overall unremarkable.  Her symptoms were felt to be due to a virus or underlying UTI.  Patient has an upcoming appointment with cardiology to complete the workup as well as an upcoming appointment with her PCP for yearly physical in October 2024.  She has not had any recurrence of these symptoms.   She denies any palpitations, chest pain, or shortness of breath.  She has not had any pleuritic chest pain. She denies any increased joint pain or joint swelling.  She experiences morning stiffness lasting for about 5 minutes intermittently.  She has not noticed any symptoms of Raynaud's phenomenon.  She denies any Maller rash.  She denies any swollen lymph nodes.  She denies any sores in her mouth or nose.  She has not noticed any sicca symptoms.  Her energy level has been stable. Patient reports that she recently had a follow-up visit scheduled with dermatology.  She was diagnosed with melanoma and had to go back for a second excision.  She continues to have photosensitivity and hair loss which has been unchanged since her last office visit.     Activities of Daily Living:  Patient reports morning stiffness for 5-10 minutes.   Patient Denies nocturnal pain.  Difficulty dressing/grooming: Denies Difficulty climbing stairs: Denies Difficulty getting out of chair: Denies Difficulty using hands for taps, buttons, cutlery, and/or writing:  Denies  Review of Systems  Constitutional:  Negative for fatigue.  HENT:  Negative for mouth sores and mouth dryness.   Eyes:  Positive for itching. Negative for dryness.  Respiratory:  Negative for shortness of breath.   Cardiovascular:  Negative for chest pain and palpitations.  Gastrointestinal:  Negative for blood in stool, constipation and diarrhea.  Endocrine: Negative for increased urination.  Genitourinary:  Negative for involuntary urination.  Musculoskeletal:  Positive for morning stiffness. Negative for joint pain, gait problem, joint pain, joint swelling, myalgias, muscle weakness, muscle tenderness and myalgias.  Skin:  Positive for hair loss. Negative for color change, rash and sensitivity to sunlight.  Allergic/Immunologic: Negative for susceptible to infections.  Neurological:  Negative for dizziness and headaches.  Hematological:  Negative for swollen glands.  Psychiatric/Behavioral:  Negative for depressed mood and sleep disturbance. The patient is not nervous/anxious.     PMFS History:  Patient Active Problem List   Diagnosis Date Noted   Osteoarthritis 12/21/2022   Hypercholesterolemia 06/05/2022   Family history of ovarian cancer 05/27/2022   Family history of colon cancer 05/27/2022   History of colonic polyps 05/27/2022   Preventative health care 06/07/2017   HTN (hypertension) 09/26/2013    Past Medical History:  Diagnosis Date   Allergy    Alopecia    Anemia    Asthma    Cataract    left eye  Colon polyps    Environmental allergies    Family history of colon cancer    Family history of ovarian cancer    Heart murmur    Hypertension    Melanoma (HCC)     Family History  Problem Relation Age of Onset   Ovarian cancer Mother 61   Hypertension Mother    Colon cancer Father 75   Heart disease Sister 51   Heart failure Maternal Grandfather 44   Colon cancer Paternal Grandmother        dx 13s, d. 81   Pancreatic cancer Other        paternal  grandmother's paternal aunt   Colon polyps Neg Hx    Esophageal cancer Neg Hx    Rectal cancer Neg Hx    Stomach cancer Neg Hx    Past Surgical History:  Procedure Laterality Date   ARM SKIN LESION BIOPSY / EXCISION     CATARACT EXTRACTION Left    CYSTECTOMY  09/29/2011   Removed form the Back   RETINAL DETACHMENT SURGERY Left 07/2020   dr Barbara Cower sanders   RETINAL DETACHMENT SURGERY Left 12/2020   sanders   Social History   Social History Narrative   Exercise--limited,     Immunization History  Administered Date(s) Administered   Influenza,inj,Quad PF,6+ Mos 06/13/2019, 06/18/2020   PFIZER(Purple Top)SARS-COV-2 Vaccination 07/01/2020   Tdap 02/12/2012, 06/22/2022   Zoster Recombinant(Shingrix) 06/09/2018, 08/12/2018     Objective: Vital Signs: BP 121/78 (BP Location: Left Arm, Patient Position: Sitting, Cuff Size: Small)   Pulse 69   Resp 12   Ht 5\' 6"  (1.676 m)   Wt 146 lb 12.8 oz (66.6 kg)   LMP 06/28/2013   BMI 23.69 kg/m    Physical Exam Vitals and nursing note reviewed.  Constitutional:      Appearance: She is well-developed.  HENT:     Head: Normocephalic and atraumatic.  Eyes:     Conjunctiva/sclera: Conjunctivae normal.  Cardiovascular:     Rate and Rhythm: Normal rate and regular rhythm.     Heart sounds: Normal heart sounds.  Pulmonary:     Effort: Pulmonary effort is normal.     Breath sounds: Normal breath sounds.  Abdominal:     General: Bowel sounds are normal.     Palpations: Abdomen is soft.  Musculoskeletal:     Cervical back: Normal range of motion.  Lymphadenopathy:     Cervical: No cervical adenopathy.  Skin:    General: Skin is warm and dry.     Capillary Refill: Capillary refill takes less than 2 seconds.  Neurological:     Mental Status: She is alert and oriented to person, place, and time.  Psychiatric:        Behavior: Behavior normal.      Musculoskeletal Exam: C-spine, thoracic spine, and lumbar spine good ROM.  Shoulder  joints, elbow joints, wrist joints, MCPs, PIPs, and DIPs good ROM with no synovitis.  Hip joints have good ROM with no groin pain.  Knee joints have good ROM with no warmth or effusion. Ankle joints have good ROM with no tenderness or joint swelling.  Ankle joints have good ROM with no tenderness or joint swelling.   CDAI Exam: CDAI Score: -- Patient Global: --; Provider Global: -- Swollen: --; Tender: -- Joint Exam 06/16/2023   No joint exam has been documented for this visit   There is currently no information documented on the homunculus. Go to the Rheumatology activity and complete  the homunculus joint exam.  Investigation: No additional findings.  Imaging: MM 3D SCREENING MAMMOGRAM BILATERAL BREAST  Result Date: 06/09/2023 CLINICAL DATA:  Screening. EXAM: DIGITAL SCREENING BILATERAL MAMMOGRAM WITH TOMOSYNTHESIS AND CAD TECHNIQUE: Bilateral screening digital craniocaudal and mediolateral oblique mammograms were obtained. Bilateral screening digital breast tomosynthesis was performed. The images were evaluated with computer-aided detection. COMPARISON:  Previous exam(s). ACR Breast Density Category c: The breasts are heterogeneously dense, which may obscure small masses. FINDINGS: In the left breast, a possible asymmetry warrants further evaluation. In the right breast, no findings suspicious for malignancy. IMPRESSION: Further evaluation is suggested for possible asymmetry in the left breast. RECOMMENDATION: Diagnostic mammogram and possibly ultrasound of the left breast. (Code:[id number]) The patient will be contacted regarding the findings, and additional imaging will be scheduled. BI-RADS CATEGORY  0: Incomplete: Need additional imaging evaluation. Electronically Signed   By: Emmaline Kluver M.D.   On: 06/09/2023 08:30    Recent Labs: Lab Results  Component Value Date   WBC 8.3 05/20/2023   HGB 13.9 05/20/2023   PLT 345 05/20/2023   NA 134 (L) 05/20/2023   K 4.4 05/20/2023   CL 97  (L) 05/20/2023   CO2 25 05/20/2023   GLUCOSE 108 (H) 05/20/2023   BUN 12 05/20/2023   CREATININE 0.66 05/20/2023   BILITOT 0.9 05/20/2023   ALKPHOS 59 05/20/2023   AST 21 05/20/2023   ALT 17 05/20/2023   PROT 8.1 05/20/2023   ALBUMIN 4.4 05/20/2023   CALCIUM 9.5 05/20/2023    Speciality Comments: No specialty comments available.  Procedures:  No procedures performed Allergies: Aloe, Elemental sulfur, Quinoa-kale-hemp [alimentum], and Wheat   Assessment / Plan:     Visit Diagnoses: Primary osteoarthritis of both hands - XR 11/12/2022 are consistent with osteoarthritis.  CMC, PIP, and DIP joint space narrowing.  No erosive changes.RF, CCP negative on 11/12/2022.  No joint tenderness or synovitis noted on examination today.  She has complete fist formation bilaterally.  Patient was advised to notify us if she develops any new or worsening symptoms.  She will follow-up in the office in 6 months or sooner if needed.  Digital mucinous cyst of finger - Right middle digit-unchanged.  Primary osteoarthritis of both feet: She is not experiencing any increased discomfort in her feet at this time.  No ankle joint synovitis noted.  Positive ANA (antinuclear antibody) - 11/12/22: ANA 1:80NS, 1:40 cytoplasmic, dsDNA-, complements WNL, ESR WNL, smith-, Ro-, La-, RF-, CRP WNL, RNP-, Scl-70-: - Patient has ongoing photosensitivity and hair loss.  She was recent evaluated by dermatology at which time there was no concern for systemic lupus/cutaneous lupus.  The symptoms remain unchanged since her last office visit.  No Maller rash was noted on exam.  No cervical lymphadenopathy was noted.  She has not had any oral or nasal ulcerations.  No increase sicca symptoms.  Her energy level has been stable.  No synovitis was noted on exam.  She has not had any shortness of breath, pleuritic chest pain, or palpitations.  She has not had any symptoms of Raynaud's phenomenon.  Her fingertips are cool to touch today but  capillary refill is less than 2 seconds.  Plan to obtain the following lab work today for further evaluation.  She was advised to notify us if she develops any new or worsening symptoms.  Plan: Anti-DNA antibody, double-stranded, C3 and C4, Sedimentation rate, Protein / creatinine ratio, urine, CBC with Differential/Platelet, COMPLETE METABOLIC PANEL WITH GFR, ANA, C-reactive protein, RNP Antibody,  Anti-Smith antibody, Sjogrens syndrome-A extractable nuclear antibody, Sjogrens syndrome-B extractable nuclear antibody  Alopecia - Diagnosed by dermatology in the past. Patchy hair loss noted on scalp.  She has tried several topical agents- no improvement.  Unknown etiology of alopecia.  Recently evaluated by dermatology-no changes made in regards to alopecia management.   Plan to obtain updated lab work today.  Photosensitivity -She has had longstanding photosensitivity.  Plan to obtain the following lab work today for further evaluation.  Plan: Anti-DNA antibody, double-stranded, C3 and C4, Sedimentation rate, Protein / creatinine ratio, urine, CBC with Differential/Platelet, COMPLETE METABOLIC PANEL WITH GFR, ANA, C-reactive protein, RNP Antibody, Anti-Smith antibody, Sjogrens syndrome-A extractable nuclear antibody, Sjogrens syndrome-B extractable nuclear antibody  Primary hypertension: Blood pressure was 121/78 today in the office.  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    Follow-Up Instructions: Return in about 6 months (around 12/14/2023) for Osteoarthritis.   Gearldine Bienenstock, PA-C  Note - This record has been created using Dragon software.  Chart creation errors have been sought, but may not always  have been located. Such creation errors do not reflect on  the standard of medical care.

## 2023-06-07 ENCOUNTER — Ambulatory Visit
Admission: RE | Admit: 2023-06-07 | Discharge: 2023-06-07 | Disposition: A | Discharge status: Home / Self Care | Payer: Federal, State, Local not specified - PPO | Source: Ambulatory Visit | Attending: Family Medicine | Admitting: Family Medicine

## 2023-06-07 DIAGNOSIS — Z1231 Encounter for screening mammogram for malignant neoplasm of breast: Secondary | ICD-10-CM | POA: Diagnosis not present

## 2023-06-09 DIAGNOSIS — N819 Female genital prolapse, unspecified: Secondary | ICD-10-CM | POA: Diagnosis not present

## 2023-06-09 DIAGNOSIS — Z01419 Encounter for gynecological examination (general) (routine) without abnormal findings: Secondary | ICD-10-CM | POA: Diagnosis not present

## 2023-06-09 DIAGNOSIS — Z124 Encounter for screening for malignant neoplasm of cervix: Secondary | ICD-10-CM | POA: Diagnosis not present

## 2023-06-09 LAB — HM PAP SMEAR

## 2023-06-10 ENCOUNTER — Other Ambulatory Visit: Payer: Self-pay | Admitting: Family Medicine

## 2023-06-10 DIAGNOSIS — R928 Other abnormal and inconclusive findings on diagnostic imaging of breast: Secondary | ICD-10-CM

## 2023-06-14 DIAGNOSIS — D0361 Melanoma in situ of right upper limb, including shoulder: Secondary | ICD-10-CM | POA: Diagnosis not present

## 2023-06-14 HISTORY — PX: ARM SKIN LESION BIOPSY / EXCISION: SUR471

## 2023-06-15 ENCOUNTER — Ambulatory Visit: Payer: Federal, State, Local not specified - PPO | Admitting: Rheumatology

## 2023-06-16 ENCOUNTER — Ambulatory Visit: Payer: Federal, State, Local not specified - PPO | Attending: Rheumatology | Admitting: Physician Assistant

## 2023-06-16 ENCOUNTER — Encounter: Payer: Self-pay | Admitting: Physician Assistant

## 2023-06-16 VITALS — BP 121/78 | HR 69 | Resp 12 | Ht 66.0 in | Wt 146.8 lb

## 2023-06-16 DIAGNOSIS — I10 Essential (primary) hypertension: Secondary | ICD-10-CM

## 2023-06-16 DIAGNOSIS — Z800 Family history of malignant neoplasm of digestive organs: Secondary | ICD-10-CM

## 2023-06-16 DIAGNOSIS — M19041 Primary osteoarthritis, right hand: Secondary | ICD-10-CM

## 2023-06-16 DIAGNOSIS — Z8709 Personal history of other diseases of the respiratory system: Secondary | ICD-10-CM

## 2023-06-16 DIAGNOSIS — M19071 Primary osteoarthritis, right ankle and foot: Secondary | ICD-10-CM | POA: Diagnosis not present

## 2023-06-16 DIAGNOSIS — M19072 Primary osteoarthritis, left ankle and foot: Secondary | ICD-10-CM

## 2023-06-16 DIAGNOSIS — R768 Other specified abnormal immunological findings in serum: Secondary | ICD-10-CM | POA: Diagnosis not present

## 2023-06-16 DIAGNOSIS — Z8041 Family history of malignant neoplasm of ovary: Secondary | ICD-10-CM

## 2023-06-16 DIAGNOSIS — C4361 Malignant melanoma of right upper limb, including shoulder: Secondary | ICD-10-CM

## 2023-06-16 DIAGNOSIS — Z86010 Personal history of colonic polyps: Secondary | ICD-10-CM

## 2023-06-16 DIAGNOSIS — L659 Nonscarring hair loss, unspecified: Secondary | ICD-10-CM

## 2023-06-16 DIAGNOSIS — L568 Other specified acute skin changes due to ultraviolet radiation: Secondary | ICD-10-CM

## 2023-06-16 DIAGNOSIS — R011 Cardiac murmur, unspecified: Secondary | ICD-10-CM

## 2023-06-16 DIAGNOSIS — M19042 Primary osteoarthritis, left hand: Secondary | ICD-10-CM

## 2023-06-16 DIAGNOSIS — M67449 Ganglion, unspecified hand: Secondary | ICD-10-CM

## 2023-06-17 NOTE — Progress Notes (Signed)
No proteinuria.  RBC count is borderline elevated. Hematocrit is borderline elevated.   CMP Wnl ESR WNL

## 2023-06-18 LAB — CBC WITH DIFFERENTIAL/PLATELET
Absolute Monocytes: 720 cells/uL (ref 200–950)
Basophils Absolute: 31 cells/uL (ref 0–200)
Basophils Relative: 0.5 %
Eosinophils Absolute: 140 cells/uL (ref 15–500)
Eosinophils Relative: 2.3 %
HCT: 46.7 % — ABNORMAL HIGH (ref 35.0–45.0)
Hemoglobin: 14.9 g/dL (ref 11.7–15.5)
Lymphs Abs: 1745 cells/uL (ref 850–3900)
MCH: 27.3 pg (ref 27.0–33.0)
MCHC: 31.9 g/dL — ABNORMAL LOW (ref 32.0–36.0)
MCV: 85.7 fL (ref 80.0–100.0)
MPV: 10.1 fL (ref 7.5–12.5)
Monocytes Relative: 11.8 %
Neutro Abs: 3465 cells/uL (ref 1500–7800)
Neutrophils Relative %: 56.8 %
Platelets: 337 10*3/uL (ref 140–400)
RBC: 5.45 10*6/uL — ABNORMAL HIGH (ref 3.80–5.10)
RDW: 13.6 % (ref 11.0–15.0)
Total Lymphocyte: 28.6 %
WBC: 6.1 10*3/uL (ref 3.8–10.8)

## 2023-06-18 LAB — PROTEIN / CREATININE RATIO, URINE
Creatinine, Urine: 48 mg/dL (ref 20–275)
Total Protein, Urine: <4 mg/dL — ABNORMAL LOW (ref 5–24)

## 2023-06-18 LAB — COMPLETE METABOLIC PANEL WITH GFR
AG Ratio: 1.4 (calc) (ref 1.0–2.5)
ALT: 15 U/L (ref 6–29)
AST: 18 U/L (ref 10–35)
Albumin: 4.6 g/dL (ref 3.6–5.1)
Alkaline phosphatase (APISO): 66 U/L (ref 37–153)
BUN: 13 mg/dL (ref 7–25)
CO2: 29 mmol/L (ref 20–32)
Calcium: 10.2 mg/dL (ref 8.6–10.4)
Chloride: 99 mmol/L (ref 98–110)
Creat: 0.67 mg/dL (ref 0.50–1.05)
Globulin: 3.2 g/dL (calc) (ref 1.9–3.7)
Glucose, Bld: 85 mg/dL (ref 65–99)
Potassium: 4.1 mmol/L (ref 3.5–5.3)
Sodium: 138 mmol/L (ref 135–146)
Total Bilirubin: 0.5 mg/dL (ref 0.2–1.2)
Total Protein: 7.8 g/dL (ref 6.1–8.1)
eGFR: 98 mL/min/{1.73_m2} (ref >=60)

## 2023-06-18 LAB — C-REACTIVE PROTEIN: CRP: <3 mg/L (ref <8.0)

## 2023-06-18 LAB — ANA: Anti Nuclear Antibody (ANA): POSITIVE — AB

## 2023-06-18 LAB — ANTI-NUCLEAR AB-TITER (ANA TITER)
ANA TITER: 1:40 {titer} — ABNORMAL HIGH
ANA Titer 1: 1:40 {titer} — ABNORMAL HIGH

## 2023-06-18 LAB — ANTI-SMITH ANTIBODY: ENA SM Ab Ser-aCnc: <1 AI

## 2023-06-18 LAB — SJOGRENS SYNDROME-B EXTRACTABLE NUCLEAR ANTIBODY: SSB (La) (ENA) Antibody, IgG: <1 AI

## 2023-06-18 LAB — SEDIMENTATION RATE: Sed Rate: 11 mm/h (ref 0–30)

## 2023-06-18 LAB — RNP ANTIBODY: Ribonucleic Protein(ENA) Antibody, IgG: <1 AI

## 2023-06-18 LAB — ANTI-DNA ANTIBODY, DOUBLE-STRANDED: ds DNA Ab: 1 IU/mL

## 2023-06-18 LAB — C3 AND C4
C3 Complement: 153 mg/dL (ref 83–193)
C4 Complement: 30 mg/dL (ref 15–57)

## 2023-06-18 LAB — SJOGRENS SYNDROME-A EXTRACTABLE NUCLEAR ANTIBODY: SSA (Ro) (ENA) Antibody, IgG: <1 AI

## 2023-06-18 NOTE — Progress Notes (Signed)
Double-stranded DNA negative C-reactive protein and sed rate normal, RNP negative Smith negative, Sjogren's antibodies negative, complements normal.  Labs do not indicate an autoimmune disease flare.

## 2023-06-21 NOTE — Progress Notes (Signed)
ANA remains positive but is a low titer. No immunosuppressive therapy required at at this time. Please advise patient to notify us if she develops any new or worsening symptoms

## 2023-06-23 DIAGNOSIS — H524 Presbyopia: Secondary | ICD-10-CM | POA: Diagnosis not present

## 2023-06-23 DIAGNOSIS — H2703 Aphakia, bilateral: Secondary | ICD-10-CM | POA: Diagnosis not present

## 2023-06-23 DIAGNOSIS — H3589 Other specified retinal disorders: Secondary | ICD-10-CM | POA: Diagnosis not present

## 2023-06-23 DIAGNOSIS — H40031 Anatomical narrow angle, right eye: Secondary | ICD-10-CM | POA: Diagnosis not present

## 2023-07-02 ENCOUNTER — Ambulatory Visit
Admission: RE | Admit: 2023-07-02 | Discharge: 2023-07-02 | Disposition: A | Discharge status: Home / Self Care | Payer: Federal, State, Local not specified - PPO | Source: Ambulatory Visit | Attending: Family Medicine | Admitting: Family Medicine

## 2023-07-02 ENCOUNTER — Ambulatory Visit: Payer: Federal, State, Local not specified - PPO

## 2023-07-02 DIAGNOSIS — R928 Other abnormal and inconclusive findings on diagnostic imaging of breast: Secondary | ICD-10-CM | POA: Diagnosis not present

## 2023-07-09 ENCOUNTER — Ambulatory Visit (INDEPENDENT_AMBULATORY_CARE_PROVIDER_SITE_OTHER): Payer: Federal, State, Local not specified - PPO | Admitting: Family Medicine

## 2023-07-09 ENCOUNTER — Encounter: Payer: Federal, State, Local not specified - PPO | Admitting: Family Medicine

## 2023-07-09 ENCOUNTER — Encounter: Payer: Self-pay | Admitting: Family Medicine

## 2023-07-09 VITALS — BP 120/76 | HR 64 | Temp 97.8°F | Resp 17 | Ht 66.0 in | Wt 148.4 lb

## 2023-07-09 DIAGNOSIS — E559 Vitamin D deficiency, unspecified: Secondary | ICD-10-CM

## 2023-07-09 DIAGNOSIS — Z0000 Encounter for general adult medical examination without abnormal findings: Secondary | ICD-10-CM

## 2023-07-09 DIAGNOSIS — I10 Essential (primary) hypertension: Secondary | ICD-10-CM | POA: Diagnosis not present

## 2023-07-09 DIAGNOSIS — C439 Malignant melanoma of skin, unspecified: Secondary | ICD-10-CM | POA: Diagnosis not present

## 2023-07-09 NOTE — Progress Notes (Signed)
Established Patient Office Visit  Subjective   Patient ID: Aven Purinton [facility], female    DOB: 1961/02/18  Age: 62 y.o. MRN: [id number]  Chief Complaint  Patient presents with   Annual Exam    Pt states fasting     HPI Discussed the use of AI scribe software for clinical note transcription with the patient, who gave verbal consent to proceed.  History of Present Illness   The patient, with a history of hypertension, heart murmur, and a slight tear in the heart, presents for a routine physical exam. She expresses concern about her current blood pressure medication, which she has been on for nearly 20 years, due to episodes of photosensitivity. The patient reports three instances over the summer where she experienced hypersensitivity to light, despite using all-natural sunscreen. She is considering changing her medication but is hesitant about potential side effects of other medications.  The patient also reports a recent episode of lightheadedness and nausea, which resulted in a visit to the emergency room. She was not given a definitive diagnosis but was recommended to see a cardiologist due to changes on her EKG. The patient has an upcoming appointment with a cardiologist.  In addition, the patient was recently diagnosed with stage zero melanoma on her right arm, which was excised. She also reports a history of a prolapse and has been referred to a urogynecologist. The patient also mentions a small nodule on her left eyelid, for which she was prescribed doxycycline and advised to apply warm compresses.  The patient also reports a history of rheumatological issues and has been seen by a rheumatologist. She has been advised to take calcium and vitamin D supplements.      Patient Active Problem List   Diagnosis Date Noted   Melanoma of skin (HCC) 05/26/2023   Osteoarthritis 12/21/2022   Hypercholesterolemia 06/05/2022   Family history of ovarian cancer 05/27/2022   Family history of colon  cancer 05/27/2022   History of colonic polyps 05/27/2022   Preventative health care 06/07/2017   HTN (hypertension) 09/26/2013   Past Medical History:  Diagnosis Date   Allergy    Alopecia    Anemia    Asthma    Cataract    left eye    Colon polyps    Environmental allergies    Family history of colon cancer    Family history of ovarian cancer    Heart murmur    Hypertension    Melanoma (HCC)    Past Surgical History:  Procedure Laterality Date   ARM SKIN LESION BIOPSY / EXCISION Right 06/14/2023   Dr Annitta Jersey   BREAST BIOPSY Left 04/10/2020   CATARACT EXTRACTION Left    CYSTECTOMY  09/29/2011   Removed form the Back   RETINAL DETACHMENT SURGERY Left 07/2020   dr Barbara Cower sanders   RETINAL DETACHMENT SURGERY Left 12/2020   sanders   Social History   Tobacco Use   Smoking status: Never    Passive exposure: Past   Smokeless tobacco: Never  Vaping Use   Vaping status: Never Used  Substance Use Topics   Alcohol use: Yes    Comment: Seldom   Drug use: No   Social History   Socioeconomic History   Marital status: Single    Spouse name: Not on file   Number of children: Not on file   Years of education: Not on file   Highest education level: Not on file  Occupational History   Occupation: retired  Tobacco Use   Smoking status: Never    Passive exposure: Past   Smokeless tobacco: Never  Vaping Use   Vaping status: Never Used  Substance and Sexual Activity   Alcohol use: Yes    Comment: Seldom   Drug use: No   Sexual activity: Yes    Partners: Male  Other Topics Concern   Not on file  Social History Narrative   Exercise--limited,     Social Determinants of Health   Financial Resource Strain: Not on file  Food Insecurity: Not on file  Transportation Needs: Not on file  Physical Activity: Not on file  Stress: Not on file  Social Connections: Unknown (06/11/2022)   Received from [facility]   Social Network    Social Network: Not  on file  Intimate Partner Violence: Unknown (06/11/2022)   Received from [facility]   HITS    Physically Hurt: Not on file    Insult or Talk Down To: Not on file    Threaten Physical Harm: Not on file    Scream or Curse: Not on file   Family Status  Relation Name Status   Mother  Deceased at age 38       ovarian ca   Father  Deceased at age 64       MI or aneurysm   Sister  Deceased at age 39       CVD   Sister  Alive   Sister  Alive   Mat Aunt  Deceased   MGM  Deceased   MGF  Deceased   PGM  Deceased   PGF  Deceased   Other  Deceased   Neg Hx  (Not Specified)  No partnership data on file   Family History  Problem Relation Age of Onset   Ovarian cancer Mother 87   Hypertension Mother    Colon cancer Father 67   Heart disease Sister 63   Heart failure Maternal Grandfather 24   Colon cancer Paternal Grandmother        dx 62s, d. 90   Pancreatic cancer Other        paternal grandmother's paternal aunt   Colon polyps Neg Hx    Esophageal cancer Neg Hx    Rectal cancer Neg Hx    Stomach cancer Neg Hx    Allergies  Allergen Reactions   Aloe Anaphylaxis    Cannot use topically- redness and swelling, diff breathing   Elemental Sulfur Anaphylaxis    SOB, Swelling, rash   Quinoa-Kale-Hemp [Alimentum]     Rash, swelling   Wheat       Review of Systems  Constitutional:  Negative for chills, fever and malaise/fatigue.  HENT:  Negative for congestion and hearing loss.   Eyes:  Negative for blurred vision and discharge.  Respiratory:  Negative for cough, sputum production and shortness of breath.   Cardiovascular:  Negative for chest pain, palpitations and leg swelling.  Gastrointestinal:  Negative for abdominal pain, blood in stool, constipation, diarrhea, heartburn, nausea and vomiting.  Genitourinary:  Negative for dysuria, frequency, hematuria and urgency.  Musculoskeletal:  Negative for back pain, falls and myalgias.  Skin:  Negative for rash.   Neurological:  Negative for dizziness, sensory change, loss of consciousness, weakness and headaches.  Endo/Heme/Allergies:  Negative for environmental allergies. Does not bruise/bleed easily.  Psychiatric/Behavioral:  Negative for depression and suicidal ideas. The patient is not nervous/anxious and does not have insomnia.  Objective:     BP 120/76 (BP Location: Left Arm, Patient Position: Sitting, Cuff Size: Normal)   Pulse 64   Temp 97.8 F (36.6 C) (Oral)   Resp 17   Ht 5\' 6"  (1.676 m)   Wt 148 lb 6.4 oz (67.3 kg)   LMP 06/28/2013   SpO2 97%   BMI 23.95 kg/m  BP Readings from Last 3 Encounters:  07/09/23 120/76  06/16/23 121/78  05/20/23 116/76   Wt Readings from Last 3 Encounters:  07/09/23 148 lb 6.4 oz (67.3 kg)  06/16/23 146 lb 12.8 oz (66.6 kg)  05/20/23 147 lb 14.9 oz (67.1 kg)   SpO2 Readings from Last 3 Encounters:  07/09/23 97%  05/20/23 97%  05/05/23 (!) 82%      Physical Exam Vitals and nursing note reviewed.  Constitutional:      General: She is not in acute distress.    Appearance: Normal appearance. She is well-developed.  HENT:     Head: Normocephalic and atraumatic.     Right Ear: Tympanic membrane, ear canal and external ear normal. There is no impacted cerumen.     Left Ear: Tympanic membrane, ear canal and external ear normal. There is no impacted cerumen.     Nose: Nose normal.     Mouth/Throat:     Mouth: Mucous membranes are moist.     Pharynx: Oropharynx is clear. No oropharyngeal exudate or posterior oropharyngeal erythema.  Eyes:     General: No scleral icterus.       Right eye: No discharge.        Left eye: No discharge.     Conjunctiva/sclera: Conjunctivae normal.     Pupils: Pupils are equal, round, and reactive to light.  Neck:     Thyroid: No thyromegaly or thyroid tenderness.     Vascular: No JVD.  Cardiovascular:     Rate and Rhythm: Normal rate and regular rhythm.     Heart sounds: Normal heart sounds. No murmur  heard. Pulmonary:     Effort: Pulmonary effort is normal. No respiratory distress.     Breath sounds: Normal breath sounds.  Abdominal:     General: Bowel sounds are normal. There is no distension.     Palpations: Abdomen is soft. There is no mass.     Tenderness: There is no abdominal tenderness. There is no guarding or rebound.  Genitourinary:    Vagina: Normal.  Musculoskeletal:        General: Normal range of motion.     Cervical back: Normal range of motion and neck supple.     Right lower leg: No edema.     Left lower leg: No edema.  Lymphadenopathy:     Cervical: No cervical adenopathy.  Skin:    General: Skin is warm and dry.     Findings: No erythema or rash.  Neurological:     Mental Status: She is alert and oriented to person, place, and time.     Cranial Nerves: No cranial nerve deficit.     Deep Tendon Reflexes: Reflexes are normal and symmetric.  Psychiatric:        Mood and Affect: Mood normal.        Behavior: Behavior normal.        Thought Content: Thought content normal.        Judgment: Judgment normal.      Results for orders placed or performed in visit on 07/09/23  HM PAP SMEAR  Result Value Ref  Range   HM Pap smear Marijo Conception       The 10-year ASCVD risk score (Arnett DK, et al., 2019) is: 7.7%    Assessment & Plan:   Problem List Items Addressed This Visit       Unprioritized   Preventative health care - Primary    Ghm utd Check labs See AVS Health Maintenance  Topic Date Due   COVID-19 Vaccine (2 - Pfizer risk series) 07/25/2023 (Originally 07/22/2020)   Zoster Vaccines- Shingrix (2 of 2) 10/09/2023 (Originally 10/07/2018)   HIV Screening  12/21/2023 (Originally 01[birth year]03/1974)   INFLUENZA VACCINE  12/27/2023 (Originally 04/29/2023)   MAMMOGRAM  06/06/2024   Cervical Cancer Screening (HPV/Pap Cotest)  06/08/2026   Colonoscopy  05/04/2028   DTaP/Tdap/Td (3 - Td or Tdap) 06/22/2032   Hepatitis C Screening  Completed   HPV VACCINES   Aged Out         Melanoma of skin (HCC)   Other Visit Diagnoses     Essential hypertension       Relevant Orders   CBC with Differential/Platelet   Comprehensive metabolic panel   Lipid panel   TSH   Vitamin D deficiency       Relevant Orders   Vitamin D (25 hydroxy)     .Marland KitchenAssessment and Plan    Hypertension Patient has been on Lisinopril for 20 years and is experiencing photosensitivity. Discussed the possibility of changing medication due to this side effect. Patient to discuss further with cardiologist. -Consider changing Lisinopril to Losartan or Amlodipine after consultation with cardiologist.  Melanoma Stage 0 melanoma excised from right arm. No current issues reported. -Follow-up with dermatologist as needed.  Uterine Prolapse Patient reports prolapse and has been referred to a urogynecologist. -Follow-up with urogynecologist as planned.  Vitamin D Deficiency Patient has a history of Vitamin D deficiency and is currently not on supplementation. -Check Vitamin D levels. -Start over-the-counter Vitamin D3 2000 units daily.  Bone Density Patient had a bone density scan approximately 3 years ago. No current issues reported. -Consider repeat bone density scan, discuss with OB/GYN.  General Health Maintenance -Continue current multivitamin regimen. -Consider adding calcium supplement, discuss with OB/GYN. -Flu shot and shingles vaccine up to date. -Consider pneumonia vaccine, discuss with pharmacist.        Return in about 6 months (around 01/07/2024), or if symptoms worsen or fail to improve.    Donato Schultz, DO

## 2023-07-09 NOTE — Assessment & Plan Note (Signed)
Ghm utd Check labs See AVS Health Maintenance  Topic Date Due   COVID-19 Vaccine (2 - Pfizer risk series) 07/25/2023 (Originally 07/22/2020)   Zoster Vaccines- Shingrix (2 of 2) 10/09/2023 (Originally 10/07/2018)   HIV Screening  12/21/2023 (Originally 01/03/1974)   INFLUENZA VACCINE  12/27/2023 (Originally 04/29/2023)   MAMMOGRAM  06/06/2024   Cervical Cancer Screening (HPV/Pap Cotest)  06/08/2026   Colonoscopy  05/04/2028   DTaP/Tdap/Td (3 - Td or Tdap) 06/22/2032   Hepatitis C Screening  Completed   HPV VACCINES  Aged Out

## 2023-07-09 NOTE — Patient Instructions (Signed)
Preventive Care 40-64 Years Old, Female Preventive care refers to lifestyle choices and visits with your health care provider that can promote health and wellness. Preventive care visits are also called wellness exams. What can I expect for my preventive care visit? Counseling Your health care provider may ask you questions about your: Medical history, including: Past medical problems. Family medical history. Pregnancy history. Current health, including: Menstrual cycle. Method of birth control. Emotional well-being. Home life and relationship well-being. Sexual activity and sexual health. Lifestyle, including: Alcohol, nicotine or tobacco, and drug use. Access to firearms. Diet, exercise, and sleep habits. Work and work environment. Sunscreen use. Safety issues such as seatbelt and bike helmet use. Physical exam Your health care provider will check your: Height and weight. These may be used to calculate your BMI (body mass index). BMI is a measurement that tells if you are at a healthy weight. Waist circumference. This measures the distance around your waistline. This measurement also tells if you are at a healthy weight and may help predict your risk of certain diseases, such as type 2 diabetes and high blood pressure. Heart rate and blood pressure. Body temperature. Skin for abnormal spots. What immunizations do I need?  Vaccines are usually given at various ages, according to a schedule. Your health care provider will recommend vaccines for you based on your age, medical history, and lifestyle or other factors, such as travel or where you work. What tests do I need? Screening Your health care provider may recommend screening tests for certain conditions. This may include: Lipid and cholesterol levels. Diabetes screening. This is done by checking your blood sugar (glucose) after you have not eaten for a while (fasting). Pelvic exam and Pap test. Hepatitis B test. Hepatitis C  test. HIV (human immunodeficiency virus) test. STI (sexually transmitted infection) testing, if you are at risk. Lung cancer screening. Colorectal cancer screening. Mammogram. Talk with your health care provider about when you should start having regular mammograms. This may depend on whether you have a family history of breast cancer. BRCA-related cancer screening. This may be done if you have a family history of breast, ovarian, tubal, or peritoneal cancers. Bone density scan. This is done to screen for osteoporosis. Talk with your health care provider about your test results, treatment options, and if necessary, the need for more tests. Follow these instructions at home: Eating and drinking  Eat a diet that includes fresh fruits and vegetables, whole grains, lean protein, and low-fat dairy products. Take vitamin and mineral supplements as recommended by your health care provider. Do not drink alcohol if: Your health care provider tells you not to drink. You are pregnant, may be pregnant, or are planning to become pregnant. If you drink alcohol: Limit how much you have to 0-1 drink a day. Know how much alcohol is in your drink. In the [place] one drink equals one 12 oz bottle of beer (355 mL), one 5 oz glass of wine (148 mL), or one 1 oz glass of hard liquor (44 mL). Lifestyle Brush your teeth every morning and night with fluoride toothpaste. Floss one time each day. Exercise for at least 30 minutes 5 or more days each week. Do not use any products that contain nicotine or tobacco. These products include cigarettes, chewing tobacco, and vaping devices, such as e-cigarettes. If you need help quitting, ask your health care provider. Do not use drugs. If you are sexually active, practice safe sex. Use a condom or other form of protection to   prevent STIs. If you do not wish to become pregnant, use a form of birth control. If you plan to become pregnant, see your health care provider for a  prepregnancy visit. Take aspirin only as told by your health care provider. Make sure that you understand how much to take and what form to take. Work with your health care provider to find out whether it is safe and beneficial for you to take aspirin daily. Find healthy ways to manage stress, such as: Meditation, yoga, or listening to music. Journaling. Talking to a trusted person. Spending time with friends and family. Minimize exposure to UV radiation to reduce your risk of skin cancer. Safety Always wear your seat belt while driving or riding in a vehicle. Do not drive: If you have been drinking alcohol. Do not ride with someone who has been drinking. When you are tired or distracted. While texting. If you have been using any mind-altering substances or drugs. Wear a helmet and other protective equipment during sports activities. If you have firearms in your house, make sure you follow all gun safety procedures. Seek help if you have been physically or sexually abused. What's next? Visit your health care provider once a year for an annual wellness visit. Ask your health care provider how often you should have your eyes and teeth checked. Stay up to date on all vaccines. This information is not intended to replace advice given to you by your health care provider. Make sure you discuss any questions you have with your health care provider. Document Revised: 03/12/2021 Document Reviewed: 03/12/2021 Elsevier Patient Education  2024 [employer]vier Inc.  

## 2023-07-10 LAB — CBC WITH DIFFERENTIAL/PLATELET
Absolute Monocytes: 661 {cells}/uL (ref 200–950)
Basophils Absolute: 41 {cells}/uL (ref 0–200)
Basophils Relative: 0.7 %
Eosinophils Absolute: 342 {cells}/uL (ref 15–500)
Eosinophils Relative: 5.8 %
HCT: 42.7 % (ref 35.0–45.0)
Hemoglobin: 13.9 g/dL (ref 11.7–15.5)
Lymphs Abs: 1994 {cells}/uL (ref 850–3900)
MCH: 27.6 pg (ref 27.0–33.0)
MCHC: 32.6 g/dL (ref 32.0–36.0)
MCV: 84.7 fL (ref 80.0–100.0)
MPV: 10.4 fL (ref 7.5–12.5)
Monocytes Relative: 11.2 %
Neutro Abs: 2862 {cells}/uL (ref 1500–7800)
Neutrophils Relative %: 48.5 %
Platelets: 347 10*3/uL (ref 140–400)
RBC: 5.04 10*6/uL (ref 3.80–5.10)
RDW: 13.5 % (ref 11.0–15.0)
Total Lymphocyte: 33.8 %
WBC: 5.9 10*3/uL (ref 3.8–10.8)

## 2023-07-10 LAB — COMPREHENSIVE METABOLIC PANEL
AG Ratio: 1.3 (calc) (ref 1.0–2.5)
ALT: 13 U/L (ref 6–29)
AST: 18 U/L (ref 10–35)
Albumin: 4.4 g/dL (ref 3.6–5.1)
Alkaline phosphatase (APISO): 70 U/L (ref 37–153)
BUN: 12 mg/dL (ref 7–25)
CO2: 25 mmol/L (ref 20–32)
Calcium: 10 mg/dL (ref 8.6–10.4)
Chloride: 98 mmol/L (ref 98–110)
Creat: 0.68 mg/dL (ref 0.50–1.05)
Globulin: 3.3 g/dL (ref 1.9–3.7)
Glucose, Bld: 79 mg/dL (ref 65–99)
Potassium: 4.1 mmol/L (ref 3.5–5.3)
Sodium: 138 mmol/L (ref 135–146)
Total Bilirubin: 0.7 mg/dL (ref 0.2–1.2)
Total Protein: 7.7 g/dL (ref 6.1–8.1)

## 2023-07-10 LAB — LIPID PANEL
Cholesterol: 253 mg/dL — ABNORMAL HIGH (ref <200)
HDL: 69 mg/dL (ref >=50)
LDL Cholesterol (Calc): 161 mg/dL — ABNORMAL HIGH
Non-HDL Cholesterol (Calc): 184 mg/dL — ABNORMAL HIGH (ref <130)
Total CHOL/HDL Ratio: 3.7 (calc) (ref <5.0)
Triglycerides: 110 mg/dL (ref <150)

## 2023-07-10 LAB — TSH: TSH: 1.75 m[IU]/L (ref 0.40–4.50)

## 2023-07-10 LAB — VITAMIN D 25 HYDROXY (VIT D DEFICIENCY, FRACTURES): Vit D, 25-Hydroxy: 29 ng/mL — ABNORMAL LOW (ref 30–100)

## 2023-07-19 ENCOUNTER — Other Ambulatory Visit: Payer: Self-pay

## 2023-07-19 DIAGNOSIS — L720 Epidermal cyst: Secondary | ICD-10-CM | POA: Diagnosis not present

## 2023-07-19 MED ORDER — VITAMIN D (ERGOCALCIFEROL) 1.25 MG (50000 UNIT) PO CAPS: 50000.0000 [IU] | ORAL_CAPSULE | ORAL | 1 refills | Status: DC

## 2023-07-23 ENCOUNTER — Other Ambulatory Visit: Payer: Self-pay

## 2023-07-23 DIAGNOSIS — T7840XA Allergy, unspecified, initial encounter: Secondary | ICD-10-CM | POA: Insufficient documentation

## 2023-07-23 DIAGNOSIS — Z9109 Other allergy status, other than to drugs and biological substances: Secondary | ICD-10-CM | POA: Insufficient documentation

## 2023-07-23 DIAGNOSIS — L659 Nonscarring hair loss, unspecified: Secondary | ICD-10-CM | POA: Insufficient documentation

## 2023-07-23 DIAGNOSIS — D0361 Melanoma in situ of right upper limb, including shoulder: Secondary | ICD-10-CM | POA: Insufficient documentation

## 2023-07-23 DIAGNOSIS — R011 Cardiac murmur, unspecified: Secondary | ICD-10-CM | POA: Insufficient documentation

## 2023-07-23 DIAGNOSIS — H269 Unspecified cataract: Secondary | ICD-10-CM | POA: Insufficient documentation

## 2023-07-23 DIAGNOSIS — D649 Anemia, unspecified: Secondary | ICD-10-CM | POA: Insufficient documentation

## 2023-07-23 DIAGNOSIS — K635 Polyp of colon: Secondary | ICD-10-CM | POA: Insufficient documentation

## 2023-07-23 DIAGNOSIS — J45909 Unspecified asthma, uncomplicated: Secondary | ICD-10-CM | POA: Insufficient documentation

## 2023-07-23 HISTORY — DX: Melanoma in situ of right upper limb, including shoulder: D03.61

## 2023-07-26 ENCOUNTER — Ambulatory Visit: Payer: Federal, State, Local not specified - PPO

## 2023-07-26 VITALS — BP 104/70 | HR 83 | Ht 66.0 in | Wt 146.0 lb

## 2023-07-26 DIAGNOSIS — E7800 Pure hypercholesterolemia, unspecified: Secondary | ICD-10-CM | POA: Diagnosis not present

## 2023-07-26 DIAGNOSIS — I10 Essential (primary) hypertension: Secondary | ICD-10-CM | POA: Diagnosis not present

## 2023-07-26 DIAGNOSIS — R9431 Abnormal electrocardiogram [ECG] [EKG]: Secondary | ICD-10-CM | POA: Diagnosis not present

## 2023-07-26 HISTORY — DX: Abnormal electrocardiogram (ECG) (EKG): R94.31

## 2023-07-26 MED ORDER — LISINOPRIL 10 MG PO TABS
10.0000 mg | ORAL_TABLET | Freq: Every day | ORAL | 3 refills | Status: DC
Start: 2023-07-26 — End: 2023-11-02

## 2023-07-26 NOTE — Assessment & Plan Note (Signed)
The 10-year ASCVD risk score (Arnett DK, et al., 2019) is: 6%   Values used to calculate the score:     Age: 62 years     Sex: Female     Is Non-Hispanic African American: Yes     Diabetic: No     Tobacco smoker: No     Systolic Blood Pressure: 104 mmHg     Is BP treated: Yes     HDL Cholesterol: 69 mg/dL     Total Cholesterol: 253 mg/dL   95-AOZH risk was not significantly elevated. We discussed dietary modifications and lifestyle modifications to reduce overall cardiovascular risk. Further restratification can be attained with calcium scoring to help deter mine if statins would be beneficial. She is agreeable to consider this and willing to proceed with this even if this is current via out-of-pocket expense.  Will order a calcium score test.

## 2023-07-26 NOTE — Assessment & Plan Note (Signed)
Concerns about prolonged QT intervals on recent ER visit. Negative high-sensitivity troponins. No significant abnormalities on EKG other than nonspecific repolarization changes.  Further workup for cardiac issues as noted above for hypertension and hyperlipidemia.  Will consider further CAD workup if these test results show any abnormalities.

## 2023-07-26 NOTE — Patient Instructions (Signed)
Medication Instructions:  Your physician has recommended you make the following change in your medication:   Stop Lisinopril hydrochlorothiazide  Start Lisinopril 10 mg daily  *If you need a refill on your cardiac medications before your next appointment, please call your pharmacy*   Lab Work: None ordered If you have labs (blood work) drawn today and your tests are completely normal, you will receive your results only by: MyChart Message (if you have MyChart) OR A paper copy in the mail If you have any lab test that is abnormal or we need to change your treatment, we will call you to review the results.   Testing/Procedures: Your physician has requested that you have an echocardiogram. Echocardiography is a painless test that uses sound waves to create images of your heart. It provides your doctor with information about the size and shape of your heart and how well your heart's chambers and valves are working. This procedure takes approximately one hour. There are no restrictions for this procedure. Please do NOT wear cologne, perfume, aftershave, or lotions (deodorant is allowed). Please arrive 15 minutes prior to your appointment time.  We will order CT coronary calcium score. It will cost $99.00 and iis due at time of scan.  Please call to schedule.    [facility] [address] [place], [place] [address] [phone] Located on the first floor in the imaging department.  Follow-Up: At [facility], you and your health needs are our priority.  As part of our continuing mission to provide you with exceptional heart care, we have created designated Provider Care Teams.  These Care Teams include your primary Cardiologist (physician) and Advanced Practice Providers (APPs -  Physician Assistants and Nurse Practitioners) who all work together to provide you with the care you need, when you need it.  We recommend signing up for the patient portal called "MyChart".   Sign up information is provided on this After Visit Summary.  MyChart is used to connect with patients for Virtual Visits (Telemedicine).  Patients are able to view lab/test results, encounter notes, upcoming appointments, etc.  Non-urgent messages can be sent to your provider as well.   To learn more about what you can do with MyChart, go to [email or web address].    Your next appointment:   2 month(s)  The format for your next appointment:   In Person  Provider:   Vern Claude Madireddy, MD   Other Instructions Echocardiogram An echocardiogram is a test that uses sound waves (ultrasound) to produce images of the heart. Images from an echocardiogram can provide important information about: Heart size and shape. The size and thickness and movement of your heart's walls. Heart muscle function and strength. Heart valve function or if you have stenosis. Stenosis is when the heart valves are too narrow. If blood is flowing backward through the heart valves (regurgitation). A tumor or infectious growth around the heart valves. Areas of heart muscle that are not working well because of poor blood flow or injury from a heart attack. Aneurysm detection. An aneurysm is a weak or damaged part of an artery wall. The wall bulges out from the normal force of blood pumping through the body. Tell a health care provider about: Any allergies you have. All medicines you are taking, including vitamins, herbs, eye drops, creams, and over-the-counter medicines. Any blood disorders you have. Any surgeries you have had. Any medical conditions you have. Whether you are pregnant or may be pregnant. What are the risks? Generally,  this is a safe test. However, problems may occur, including an allergic reaction to dye (contrast) that may be used during the test. What happens before the test? No specific preparation is needed. You may eat and drink normally. What happens during the test? You will take off your  clothes from the waist up and put on a hospital gown. Electrodes or electrocardiogram (ECG)patches may be placed on your chest. The electrodes or patches are then connected to a device that monitors your heart rate and rhythm. You will lie down on a table for an ultrasound exam. A gel will be applied to your chest to help sound waves pass through your skin. A handheld device, called a transducer, will be pressed against your chest and moved over your heart. The transducer produces sound waves that travel to your heart and bounce back (or "echo" back) to the transducer. These sound waves will be captured in real-time and changed into images of your heart that can be viewed on a video monitor. The images will be recorded on a computer and reviewed by your health care provider. You may be asked to change positions or hold your breath for a short time. This makes it easier to get different views or better views of your heart. In some cases, you may receive contrast through an IV in one of your veins. This can improve the quality of the pictures from your heart. The procedure may vary among health care providers and hospitals.   What can I expect after the test? You may return to your normal, everyday life, including diet, activities, and medicines, unless your health care provider tells you not to do that. Follow these instructions at home: It is up to you to get the results of your test. Ask your health care provider, or the department that is doing the test, when your results will be ready. Keep all follow-up visits. This is important. Summary An echocardiogram is a test that uses sound waves (ultrasound) to produce images of the heart. Images from an echocardiogram can provide important information about the size and shape of your heart, heart muscle function, heart valve function, and other possible heart problems. You do not need to do anything to prepare before this test. You may eat and drink  normally. After the echocardiogram is completed, you may return to your normal, everyday life, unless your health care provider tells you not to do that. This information is not intended to replace advice given to you by your health care provider. Make sure you discuss any questions you have with your health care provider. Document Revised: 05/07/2020 Document Reviewed: 05/07/2020 Elsevier Patient Education  2021 Elsevier Inc.   Important Information About Sugar

## 2023-07-26 NOTE — Progress Notes (Unsigned)
Cardiology Consultation:    Date:  07/26/2023   ID:  Savannah Deleon, [place] 10-09-1960, MRN [id number]  PCP:  Zola Button, Grayling Congress, DO  Cardiologist:  Luretha Murphy, MD   Referring MD: Alvira Monday, MD   No chief complaint on file. Abnormal EKG findings, hypertension, hyperlipidemia   ASSESSMENT AND PLAN:   62 year old woman with history of hypertension, hyperlipidemia, osteoarthritis, s/p excision of right upper arm melanoma, asthma, environmental allergies, undergoing abdominal surgical workup for her history of photosensitivity, intermittent skin nodules associated with arthralgias.   Problem List Items Addressed This Visit     HTN (hypertension) - Primary    Blood pressure well-controlled on current therapy with lisinopril 10 mg/hydrochlorothiazide 12.5 mg once daily regimen. However she is concerned about potential side effects from her current medication and was wondering if this can be de-escalated.  We reviewed given her underlying concerns about rheumatological illnesses, we can wean her off hydrochlorothiazide.  Discontinue her current combination medication. Will prescribe lisinopril 10 mg once daily. Advised her to keep a close log of her blood pressure readings using an upper arm cuff at least once a day in the morning.  If the readings are consistently above 130 over 80 mmHg she should notify us or her PCP in order to titrate up the dose of lisinopril.  Obtain a transthoracic echocardiogram for baseline cardiac structure and function assessment in the setting of longstanding hypertensive history and nonspecific repolarization changes on the EKG.      Relevant Medications   lisinopril (ZESTRIL) 10 MG tablet   Other Relevant Orders   EKG 12-Lead (Completed)   ECHOCARDIOGRAM COMPLETE   Hypercholesterolemia    The 10-year ASCVD risk score (Arnett DK, et al., 2019) is: 6%   Values used to calculate the score:     Age: 69 years     Sex: Female      Is Non-Hispanic African American: Yes     Diabetic: No     Tobacco smoker: No     Systolic Blood Pressure: 104 mmHg     Is BP treated: Yes     HDL Cholesterol: 69 mg/dL     Total Cholesterol: 253 mg/dL   25-DGUY risk was not significantly elevated. We discussed dietary modifications and lifestyle modifications to reduce overall cardiovascular risk. Further restratification can be attained with calcium scoring to help deter mine if statins would be beneficial. She is agreeable to consider this and willing to proceed with this even if this is current via out-of-pocket expense.  Will order a calcium score test.       Relevant Medications   lisinopril (ZESTRIL) 10 MG tablet   Other Relevant Orders   CT CARDIAC SCORING   Nonspecific abnormal electrocardiogram (ECG) (EKG)    Concerns about prolonged QT intervals on recent ER visit. Negative high-sensitivity troponins. No significant abnormalities on EKG other than nonspecific repolarization changes.  Further workup for cardiac issues as noted above for hypertension and hyperlipidemia.  Will consider further CAD workup if these test results show any abnormalities.         History of Present Illness:    Savannah Deleon is a 62 y.o. female who is being seen today for the evaluation of abnormal EKG findings on recent ER visit, high blood pressure and hyperlipidemia at the request of Alvira Monday, MD.   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  Undergoing rheumatological workup for photosensitivity rash  and intermittent nodules on her hands associated with arthralgias. Denies any prior history of MI, CVA, CHF.  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  Has no significant physical limitations but does not as regularly. In August she had a visit to the ER for symptoms associated  with epigastric discomfort nausea and vomiting.  Workup in the ER unremarkable for any significant cardiac abnormalities.  EKG in the ER noted nonspecific T wave changes and prolonged QT.  She was recommended follow-up with cardiology.  She reports doing well.  No significant symptoms of chest pain or shortness of breath.  No palpitations, orthopnea, paroxysmal nocturnal dyspnea. Mentions she has been on blood pressure medications lisinopril/hydrochlorothiazide combination since her 40s.  She is concerned about hair thinning over the years and wonders if this is medication related also was wondering if the photosensitivity symptoms are related to her current antihypertensive medications.  She is currently not on any lipid-lowering therapy.  Lipid panel in the past couple years noted LDL ranging from 130s to 160s.  She denies smoking, alcohol use, recreational drug use.  EKG in the clinic today shows sinus rhythm heart rate 83/min, normal PR interval 1 6 3  ms, QTc 411 ms.  In comparison prior EKG from May 20, 2023 showed sinus rhythm heart rate 88/min, nonspecific T wave changes in inferolateral leads  The 10-year ASCVD risk score (Arnett DK, et al., 2019) is: 6%   Values used to calculate the score:     Age: 52 years     Sex: Female     Is Non-Hispanic African American: Yes     Diabetic: No     Tobacco smoker: No     Systolic Blood Pressure: 104 mmHg     Is BP treated: Yes     HDL Cholesterol: 69 mg/dL     Total Cholesterol: 253 mg/dL  Past Medical History:  Diagnosis Date   Allergy    Alopecia    Anemia    Asthma    Cataract    left eye    Colon polyps    Environmental allergies    Family history of colon cancer    Family history of ovarian cancer    Heart murmur    History of colonic polyps 05/27/2022   HTN (hypertension) 09/26/2013   Hypercholesterolemia 06/05/2022   Melanoma in situ of right upper arm (HCC) 07/23/2023   Melanoma of skin (HCC) 05/26/2023   [place]      Osteoarthritis 12/21/2022   Preventative health care 06/07/2017    Past Surgical History:  Procedure Laterality Date   ARM SKIN LESION BIOPSY / EXCISION Right 06/14/2023   Dr Annitta Jersey   BREAST BIOPSY Left 04/10/2020   CATARACT EXTRACTION Left    CYSTECTOMY  09/29/2011   Removed form the Back   RETINAL DETACHMENT SURGERY Left 07/2020   dr Barbara Cower sanders   RETINAL DETACHMENT SURGERY Left 12/2020   sanders    Current Medications: Current Meds  Medication Sig   lisinopril (ZESTRIL) 10 MG tablet Take 1 tablet (10 mg total) by mouth daily.   Multiple Vitamins-Minerals (MULTIVITAMIN PO) Take 1 tablet by mouth daily.   Vitamin D, Ergocalciferol, (DRISDOL) 1.25 MG (50000 UNIT) CAPS capsule Take 1 capsule (50,000 Units total) by mouth every 7 (seven) days.   [DISCONTINUED] lisinopril-hydrochlorothiazide (ZESTORETIC) 10-12.5 MG tablet Take 1 tablet by mouth daily.     Allergies:   Aloe,  Elemental sulfur, Quinoa-kale-hemp [alimentum], and Wheat   Social History   Socioeconomic History   Marital status: Single    Spouse name: Not on file   Number of children: Not on file   Years of education: Not on file   Highest education level: Not on file  Occupational History   Occupation: retired  Tobacco Use   Smoking status: Never    Passive exposure: Past   Smokeless tobacco: Never  Vaping Use   Vaping status: Never Used  Substance and Sexual Activity   Alcohol use: Yes    Comment: Seldom   Drug use: No   Sexual activity: Yes    Partners: Male  Other Topics Concern   Not on file  Social History Narrative   Exercise--limited,     Social Determinants of Health   Financial Resource Strain: Not on file  Food Insecurity: Not on file  Transportation Needs: Not on file  Physical Activity: Not on file  Stress: Not on file  Social Connections: Unknown (06/11/2022)   Received [facility]   Social Network    Social Network: Not on file     Family  History: The patient's family history includes Colon cancer in her paternal grandmother; Colon cancer (age of onset: 78) in her father; Heart disease (age of onset: 40) in her sister; Heart failure (age of onset: 77) in her maternal grandfather; Hypertension in her mother; Ovarian cancer (age of onset: 26) in her mother; Pancreatic cancer in an other family member. There is no history of Colon polyps, Esophageal cancer, Rectal cancer, or Stomach cancer. ROS:   Please see the history of present illness.    All 14 point review of systems negative except as described per history of present illness.  EKGs/Labs/Other Studies Reviewed:    The following studies were reviewed today:   EKG:  EKG Interpretation Date/Time:  Monday July 26 2023 14:06:47 EDT Ventricular Rate:  83 PR Interval:  160 QRS Duration:  90 QT Interval:  350 QTC Calculation: 411 R Axis:   63  Text Interpretation: Normal sinus rhythm with sinus arrhythmia Normal ECG When compared with ECG of 20-May-2023 18:36, PREVIOUS ECG IS PRESENT Confirmed by Huntley Dec reddy [phone]) on 07/26/2023 2:53:15 PM    Recent Labs: 07/09/2023: ALT 13; BUN 12; Creat 0.68; Hemoglobin 13.9; Platelets 347; Potassium 4.1; Sodium 138; TSH 1.75  Recent Lipid Panel    Component Value Date/Time   CHOL 253 (H) 07/09/2023 1422   TRIG 110 07/09/2023 1422   HDL 69 07/09/2023 1422   CHOLHDL 3.7 07/09/2023 1422   VLDL 22.6 12/21/2022 1335   LDLCALC 161 (H) 07/09/2023 1422    Physical Exam:    VS:  BP 104/70   Pulse 83   Ht 5\' 6"  (1.676 m)   Wt 146 lb (66.2 kg)   LMP 06/28/2013   SpO2 96%   BMI 23.57 kg/m     Wt Readings from Last 3 Encounters:  07/26/23 146 lb (66.2 kg)  07/09/23 148 lb 6.4 oz (67.3 kg)  06/16/23 146 lb 12.8 oz (66.6 kg)     GENERAL:  Well nourished, well developed in no acute distress NECK: No JVD; No carotid bruits CARDIAC: RRR, S1 and S2 present, no murmurs, no rubs, no gallops CHEST:  Clear to  auscultation without rales, wheezing or rhonchi  Extremities: No pitting pedal edema. Pulses bilaterally symmetric with radial 2+ and dorsalis pedis 2+ NEUROLOGIC:  Alert and oriented x 3  Medication  Adjustments/Labs and Tests Ordered: Current medicines are reviewed at length with the patient today.  Concerns regarding medicines are outlined above.  Orders Placed This Encounter  Procedures   CT CARDIAC SCORING   EKG 12-Lead   ECHOCARDIOGRAM COMPLETE   Meds ordered this encounter  Medications   lisinopril (ZESTRIL) 10 MG tablet    Sig: Take 1 tablet (10 mg total) by mouth daily.    Dispense:  90 tablet    Refill:  3    Signed, Tayllor Breitenstein reddy Tionne Dayhoff, MD, MPH, [facility]. 07/26/2023 3:28 PM    [facility]

## 2023-07-26 NOTE — Assessment & Plan Note (Addendum)
Blood pressure well-controlled on current therapy with lisinopril 10 mg/hydrochlorothiazide 12.5 mg once daily regimen. However she is concerned about potential side effects from her current medication and was wondering if this can be de-escalated.  We reviewed given her underlying concerns about rheumatological illnesses, we can wean her off hydrochlorothiazide.  Discontinue her current combination medication. Will prescribe lisinopril 10 mg once daily. Advised her to keep a close log of her blood pressure readings using an upper arm cuff at least once a day in the morning.  If the readings are consistently above 130 over 80 mmHg she should notify us or her PCP in order to titrate up the dose of lisinopril.  Obtain a transthoracic echocardiogram for baseline cardiac structure and function assessment in the setting of longstanding hypertensive history and nonspecific repolarization changes on the EKG.

## 2023-07-27 ENCOUNTER — Other Ambulatory Visit: Payer: Self-pay

## 2023-07-27 DIAGNOSIS — I10 Essential (primary) hypertension: Secondary | ICD-10-CM

## 2023-07-27 DIAGNOSIS — E7800 Pure hypercholesterolemia, unspecified: Secondary | ICD-10-CM

## 2023-07-27 DIAGNOSIS — R9431 Abnormal electrocardiogram [ECG] [EKG]: Secondary | ICD-10-CM

## 2023-08-23 ENCOUNTER — Ambulatory Visit (HOSPITAL_BASED_OUTPATIENT_CLINIC_OR_DEPARTMENT_OTHER)
Admission: RE | Admit: 2023-08-23 | Discharge: 2023-08-23 | Disposition: A | Discharge status: Home / Self Care | Payer: Federal, State, Local not specified - PPO | Source: Ambulatory Visit

## 2023-08-23 DIAGNOSIS — E7800 Pure hypercholesterolemia, unspecified: Secondary | ICD-10-CM

## 2023-08-23 DIAGNOSIS — R9431 Abnormal electrocardiogram [ECG] [EKG]: Secondary | ICD-10-CM | POA: Insufficient documentation

## 2023-08-23 LAB — ECHOCARDIOGRAM COMPLETE
AR max vel: 2.2 cm2
AV Area VTI: 2.16 cm2
AV Area mean vel: 2.07 cm2
AV Mean grad: 3 mm[Hg]
AV Peak grad: 6.3 mm[Hg]
Ao pk vel: 1.25 m/s
Area-P 1/2: 5.62 cm2
Calc EF: 63.8 %
S' Lateral: 2.9 cm
Single Plane A2C EF: 67.6 %
Single Plane A4C EF: 61.7 %

## 2023-08-31 DIAGNOSIS — I2510 Atherosclerotic heart disease of native coronary artery without angina pectoris: Secondary | ICD-10-CM

## 2023-08-31 HISTORY — DX: Atherosclerotic heart disease of native coronary artery without angina pectoris: I25.10

## 2023-09-01 ENCOUNTER — Telehealth: Payer: Self-pay

## 2023-09-01 NOTE — Telephone Encounter (Signed)
Left message for patient to call back  

## 2023-09-01 NOTE — Telephone Encounter (Signed)
-----   Message from Campbell R Madireddy sent at 08/31/2023  1:55 PM EST ----- Mildly elevated calcium score. Recommending statins. Send a note on MyChart. If she is agreeable please send out a prescription for rosuvastatin 10 mg once a day. Thank you

## 2023-09-02 MED ORDER — ROSUVASTATIN CALCIUM 10 MG PO TABS: 10.0000 mg | ORAL_TABLET | Freq: Every day | ORAL | 3 refills | Status: DC

## 2023-09-02 NOTE — Addendum Note (Signed)
Addended by: Eleonore Chiquito on: 09/02/2023 07:43 AM   Modules accepted: Orders

## 2023-10-11 ENCOUNTER — Ambulatory Visit: Payer: Federal, State, Local not specified - PPO

## 2023-11-02 ENCOUNTER — Other Ambulatory Visit: Payer: Self-pay

## 2023-11-03 ENCOUNTER — Ambulatory Visit: Payer: Self-pay | Admitting: Podiatry

## 2023-11-04 ENCOUNTER — Ambulatory Visit: Payer: Federal, State, Local not specified - PPO

## 2023-12-01 ENCOUNTER — Ambulatory Visit: Payer: Federal, State, Local not specified - PPO

## 2023-12-31 ENCOUNTER — Other Ambulatory Visit: Payer: Self-pay | Admitting: Family Medicine

## 2023-12-31 DIAGNOSIS — I10 Essential (primary) hypertension: Secondary | ICD-10-CM

## 2023-12-31 MED ORDER — LISINOPRIL 10 MG PO TABS: 10.0000 mg | ORAL_TABLET | Freq: Every day | ORAL | 0 refills | Status: DC

## 2024-01-03 ENCOUNTER — Ambulatory Visit: Payer: Federal, State, Local not specified - PPO

## 2024-01-07 ENCOUNTER — Ambulatory Visit (INDEPENDENT_AMBULATORY_CARE_PROVIDER_SITE_OTHER): Payer: Federal, State, Local not specified - PPO | Admitting: Family Medicine

## 2024-01-07 ENCOUNTER — Encounter: Payer: Self-pay | Admitting: Family Medicine

## 2024-01-07 VITALS — BP 126/78 | HR 73 | Temp 97.8°F | Resp 12 | Ht 65.5 in | Wt 142.2 lb

## 2024-01-07 DIAGNOSIS — I10 Essential (primary) hypertension: Secondary | ICD-10-CM

## 2024-01-07 DIAGNOSIS — E785 Hyperlipidemia, unspecified: Secondary | ICD-10-CM | POA: Diagnosis not present

## 2024-01-07 MED ORDER — LISINOPRIL-HYDROCHLOROTHIAZIDE 10-12.5 MG PO TABS: 1.0000 | ORAL_TABLET | Freq: Every day | ORAL | Status: DC

## 2024-01-07 MED ORDER — LISINOPRIL-HYDROCHLOROTHIAZIDE 10-12.5 MG PO TABS: 1.0000 | ORAL_TABLET | Freq: Every day | ORAL | 1 refills | Status: DC

## 2024-01-07 NOTE — Patient Instructions (Signed)
 Cholesterol Content in Foods Cholesterol is a waxy, fat-like substance that helps to carry fat in the blood. The body needs cholesterol in small amounts, but too much cholesterol can cause damage to the arteries and heart. What foods have cholesterol?  Cholesterol is found in animal-based foods, such as meat, seafood, and dairy. Generally, low-fat dairy and lean meats have less cholesterol than full-fat dairy and fatty meats. The milligrams of cholesterol per serving (mg per serving) of common cholesterol-containing foods are listed below. Meats and other proteins Egg -- one large whole egg has 186 mg. Veal shank -- 4 oz (113 g) has 141 mg. Lean ground [place] (93% lean) -- 4 oz (113 g) has 118 mg. Fat-trimmed lamb loin -- 4 oz (113 g) has 106 mg. Lean ground beef (90% lean) -- 4 oz (113 g) has 100 mg. Lobster -- 3.5 oz (99 g) has 90 mg. Pork loin chops -- 4 oz (113 g) has 86 mg. Canned salmon -- 3.5 oz (99 g) has 83 mg. Fat-trimmed beef top loin -- 4 oz (113 g) has 78 mg. Frankfurter -- 1 frank (3.5 oz or 99 g) has 77 mg. Crab -- 3.5 oz (99 g) has 71 mg. Roasted chicken without skin, white meat -- 4 oz (113 g) has 66 mg. Light bologna -- 2 oz (57 g) has 45 mg. Deli-cut [place] -- 2 oz (57 g) has 31 mg. Canned tuna -- 3.5 oz (99 g) has 31 mg. Savannah Deleon -- 1 oz (28 g) has 29 mg. Oysters and mussels (raw) -- 3.5 oz (99 g) has 25 mg. Mackerel -- 1 oz (28 g) has 22 mg. Trout -- 1 oz (28 g) has 20 mg. Pork sausage -- 1 link (1 oz or 28 g) has 17 mg. Salmon -- 1 oz (28 g) has 16 mg. Tilapia -- 1 oz (28 g) has 14 mg. Dairy Soft-serve ice cream --  cup (4 oz or 86 g) has 103 mg. Whole-milk yogurt -- 1 cup (8 oz or 245 g) has 29 mg. Cheddar cheese -- 1 oz (28 g) has 28 mg. American cheese -- 1 oz (28 g) has 28 mg. Whole milk -- 1 cup (8 oz or 250 mL) has 23 mg. 2% milk -- 1 cup (8 oz or 250 mL) has 18 mg. Cream cheese -- 1 tablespoon (Tbsp) (14.5 g) has 15 mg. Cottage cheese --  cup (4 oz or  113 g) has 14 mg. Low-fat (1%) milk -- 1 cup (8 oz or 250 mL) has 10 mg. Sour cream -- 1 Tbsp (12 g) has 8.5 mg. Low-fat yogurt -- 1 cup (8 oz or 245 g) has 8 mg. Nonfat Greek yogurt -- 1 cup (8 oz or 228 g) has 7 mg. Half-and-half cream -- 1 Tbsp (15 mL) has 5 mg. Fats and oils Cod liver oil -- 1 tablespoon (Tbsp) (13.6 g) has 82 mg. Butter -- 1 Tbsp (14 g) has 15 mg. Lard -- 1 Tbsp (12.8 g) has 14 mg. Bacon grease -- 1 Tbsp (12.9 g) has 14 mg. Mayonnaise -- 1 Tbsp (13.8 g) has 5-10 mg. Margarine -- 1 Tbsp (14 g) has 3-10 mg. The items listed above may not be a complete list of foods with cholesterol. Exact amounts of cholesterol in these foods may vary depending on specific ingredients and brands. Contact a dietitian for more information. What foods do not have cholesterol? Most plant-based foods do not have cholesterol unless you combine them with a food that has  cholesterol. Foods without cholesterol include: Grains and cereals. Vegetables. Fruits. Vegetable oils, such as olive, canola, and sunflower oil. Legumes, such as peas, beans, and lentils. Nuts and seeds. Egg whites. The items listed above may not be a complete list of foods that do not have cholesterol. Contact a dietitian for more information. Summary The body needs cholesterol in small amounts, but too much cholesterol can cause damage to the arteries and heart. Cholesterol is found in animal-based foods, such as meat, seafood, and dairy. Generally, low-fat dairy and lean meats have less cholesterol than full-fat dairy and fatty meats. This information is not intended to replace advice given to you by your health care provider. Make sure you discuss any questions you have with your health care provider. Document Revised: 01/24/2021 Document Reviewed: 01/24/2021 Elsevier Patient Education  2024 [employer].

## 2024-01-07 NOTE — Progress Notes (Signed)
 Established Patient Office Visit  Subjective   Patient ID: Savannah Deleon [facility], female    DOB: [birth year]  Age: 63 y.o. MRN: [id number]  Chief Complaint  Patient presents with   6 month follow up    HPI Discussed the use of AI scribe software for clinical note transcription with the patient, who gave verbal consent to proceed.  History of Present Illness Savannah Deleon is a 63 year old female with hypertension who presents for medication management and follow-up on recent cardiology evaluations.  She has been managing her hypertension with lisinopril HCTZ at a dose of 20/12.5 mg. Recently, a cardiologist adjusted her prescription, but she has not initiated the new medication due to concerns about potential side effects. Her blood pressure readings are slightly elevated compared to her usual levels.  In July, she experienced an episode that necessitated an ER visit, leading to a cardiology referral. The cardiologist conducted an echocardiogram and a CT scan for calcium scoring, which revealed a score of 48. Consequently, rosuvastatin was prescribed, but she has not commenced this medication due to apprehensions about side effects.  Her family history is notable for cardiovascular disease. Her father died at 36 from unknown causes, her sister at 58 from cardiovascular disease, and her great-great-grandfather at 29 from a heart attack. Her mother passed away at 4 from ovarian cancer.  No swelling in the ankles.   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) 07/23/2023   Allergy    Alopecia    Anemia    Asthma    Cataract    Colon polyps    Environmental allergies    Heart murmur    Melanoma of skin (HCC) 05/26/2023   Osteoarthritis 12/21/2022   Hypercholesterolemia 06/05/2022    Family history of ovarian cancer 05/27/2022   Family history of colon cancer 05/27/2022   History of colonic polyps 05/27/2022   Preventative health care 06/07/2017   HTN (hypertension) 09/26/2013   Past Medical History:  Diagnosis Date   Allergy    Alopecia    Anemia    Asthma    Cataract    left eye    Colon polyps    Coronary atherosclerosis, calcium score 48.3 on 08/25/2023 08/31/2023   Environmental allergies    Family history of colon cancer    Family history of ovarian cancer    Heart murmur    History of colonic polyps 05/27/2022   HTN (hypertension) 09/26/2013   Hypercholesterolemia 06/05/2022   Melanoma in situ of right upper arm (HCC) 07/23/2023   Melanoma of skin (HCC) 05/26/2023   [place]     Nonspecific abnormal electrocardiogram (ECG) (EKG) 07/26/2023   Osteoarthritis 12/21/2022   Preventative health care 06/07/2017   Past Surgical History:  Procedure Laterality Date   ARM SKIN LESION BIOPSY / EXCISION Right 06/14/2023   Dr Alanson Alliance   BREAST BIOPSY Left 04/10/2020   CATARACT EXTRACTION Left    CYSTECTOMY  09/29/2011   Removed form the Back   RETINAL DETACHMENT SURGERY Left 07/2020   dr Reymundo Caulk sanders   RETINAL DETACHMENT SURGERY Left 12/2020   sanders   Social History   Tobacco Use   Smoking status: Never    Passive exposure: Past   Smokeless tobacco: Never  Vaping Use  Vaping status: Never Used  Substance Use Topics   Alcohol use: Yes    Comment: Seldom   Drug use: No   Social History   Socioeconomic History   Marital status: Single    Spouse name: Not on file   Number of children: Not on file   Years of education: Not on file   Highest education level: Not on file  Occupational History   Occupation: retired  Tobacco Use   Smoking status: Never    Passive exposure: Past   Smokeless tobacco: Never  Vaping Use   Vaping status: Never Used  Substance and Sexual Activity   Alcohol use: Yes    Comment: Seldom   Drug use: No    Sexual activity: Yes    Partners: Male  Other Topics Concern   Not on file  Social History Narrative   Exercise--limited,     Social Drivers of Health   Financial Resource Strain: Not on file  Food Insecurity: Not on file  Transportation Needs: Not on file  Physical Activity: Not on file  Stress: Not on file  Social Connections: Unknown (06/11/2022)   Received from [facility], [facility]   Social Network    Social Network: Not on file  Intimate Partner Violence: Unknown (06/11/2022)   Received from [facility], [facility]   HITS    Physically Hurt: Not on file    Insult or Talk Down To: Not on file    Threaten Physical Harm: Not on file    Scream or Curse: Not on file   Family Status  Relation Name Status   Mother  Deceased at age 27       ovarian ca   Father  Deceased at age 82       MI or aneurysm   Sister  Deceased at age 43       CVD   Sister  Alive   Sister  Alive   Mat Aunt  Deceased   MGM  Deceased   MGF  Deceased   PGM  Deceased   PGF  Deceased   Other  Deceased   Neg Hx  (Not Specified)  No partnership data on file   Family History  Problem Relation Age of Onset   Ovarian cancer Mother 82   Hypertension Mother    Colon cancer Father 38   Heart disease Sister 15   Heart failure Maternal Grandfather 42   Colon cancer Paternal Grandmother        dx 80s, d. 36   Pancreatic cancer Other        paternal grandmother's paternal aunt   Colon polyps Neg Hx    Esophageal cancer Neg Hx    Rectal cancer Neg Hx    Stomach cancer Neg Hx    Allergies  Allergen Reactions   Aloe Anaphylaxis    Cannot use topically- redness and swelling, diff breathing   Elemental Sulfur Anaphylaxis    SOB, Swelling, rash   Quinoa-Kale-Hemp [Alimentum]     Rash, swelling   Wheat       Review of Systems  Constitutional:  Negative for chills, fever and malaise/fatigue.  HENT:  Negative for congestion and hearing loss.   Eyes:  Negative for blurred vision and  discharge.  Respiratory:  Negative for cough, sputum production and shortness of breath.   Cardiovascular:  Negative for chest pain, palpitations and leg swelling.  Gastrointestinal:  Negative for abdominal pain, blood in stool, constipation, diarrhea, heartburn, nausea  and vomiting.  Genitourinary:  Negative for dysuria, frequency, hematuria and urgency.  Musculoskeletal:  Negative for back pain, falls and myalgias.  Skin:  Negative for rash.  Neurological:  Negative for dizziness, sensory change, loss of consciousness, weakness and headaches.  Endo/Heme/Allergies:  Negative for environmental allergies. Does not bruise/bleed easily.  Psychiatric/Behavioral:  Negative for depression and suicidal ideas. The patient is not nervous/anxious and does not have insomnia.       Objective:     BP 126/78 (BP Location: Right Arm, Patient Position: Sitting, Cuff Size: Normal)   Pulse 73   Temp 97.8 F (36.6 C) (Oral)   Resp 12   Ht 5' 5.5" (1.664 m)   Wt 142 lb 3.2 oz (64.5 kg)   LMP 06/28/2013   SpO2 96%   BMI 23.30 kg/m  BP Readings from Last 3 Encounters:  01/07/24 126/78  07/26/23 104/70  07/09/23 120/76   Wt Readings from Last 3 Encounters:  01/07/24 142 lb 3.2 oz (64.5 kg)  07/26/23 146 lb (66.2 kg)  07/09/23 148 lb 6.4 oz (67.3 kg)   SpO2 Readings from Last 3 Encounters:  01/07/24 96%  07/26/23 96%  07/09/23 97%      Physical Exam Vitals and nursing note reviewed.  Constitutional:      General: She is not in acute distress.    Appearance: Normal appearance. She is well-developed.  HENT:     Head: Normocephalic and atraumatic.  Eyes:     General: No scleral icterus.       Right eye: No discharge.        Left eye: No discharge.  Cardiovascular:     Rate and Rhythm: Normal rate and regular rhythm.     Heart sounds: No murmur heard. Pulmonary:     Effort: Pulmonary effort is normal. No respiratory distress.     Breath sounds: Normal breath sounds.  Musculoskeletal:         General: Normal range of motion.     Cervical back: Normal range of motion and neck supple.     Right lower leg: No edema.     Left lower leg: No edema.  Skin:    General: Skin is warm and dry.  Neurological:     General: No focal deficit present.     Mental Status: She is alert and oriented to person, place, and time.  Psychiatric:        Mood and Affect: Mood normal.        Behavior: Behavior normal.        Thought Content: Thought content normal.        Judgment: Judgment normal.     Results for orders placed or performed in visit on 01/07/24  Comprehensive metabolic panel with GFR  Result Value Ref Range   Glucose, Bld 83 65 - 99 mg/dL   BUN 12 7 - 25 mg/dL   Creat 1.61 0.96 - 0.45 mg/dL   eGFR 99 > OR = 60 WU/JWJ/1.91Y7   BUN/Creatinine Ratio SEE NOTE: 6 - 22 (calc)   Sodium 138 135 - 146 mmol/L   Potassium 4.5 3.5 - 5.3 mmol/L   Chloride 98 98 - 110 mmol/L   CO2 30 20 - 32 mmol/L   Calcium 10.1 8.6 - 10.4 mg/dL   Total Protein 7.9 6.1 - 8.1 g/dL   Albumin 4.6 3.6 - 5.1 g/dL   Globulin 3.3 1.9 - 3.7 g/dL (calc)   AG Ratio 1.4 1.0 - 2.5 (calc)  Total Bilirubin 0.8 0.2 - 1.2 mg/dL   Alkaline phosphatase (APISO) 64 37 - 153 U/L   AST 19 10 - 35 U/L   ALT 15 6 - 29 U/L  Lipid panel  Result Value Ref Range   Cholesterol 274 (H) <200 mg/dL   HDL 83 > OR = 50 mg/dL   Triglycerides 409 <811 mg/dL   LDL Cholesterol (Calc) 167 (H) mg/dL (calc)   Total CHOL/HDL Ratio 3.3 <5.0 (calc)   Non-HDL Cholesterol (Calc) 191 (H) <130 mg/dL (calc)    Last CBC Lab Results  Component Value Date   WBC 5.9 07/09/2023   HGB 13.9 07/09/2023   HCT 42.7 07/09/2023   MCV 84.7 07/09/2023   MCH 27.6 07/09/2023   RDW 13.5 07/09/2023   PLT 347 07/09/2023   Last metabolic panel Lab Results  Component Value Date   GLUCOSE 83 01/07/2024   NA 138 01/07/2024   K 4.5 01/07/2024   CL 98 01/07/2024   CO2 30 01/07/2024   BUN 12 01/07/2024   CREATININE 0.62 01/07/2024   EGFR 99  01/07/2024   CALCIUM 10.1 01/07/2024   PROT 7.9 01/07/2024   ALBUMIN 4.4 05/20/2023   BILITOT 0.8 01/07/2024   ALKPHOS 59 05/20/2023   AST 19 01/07/2024   ALT 15 01/07/2024   ANIONGAP 12 05/20/2023   Last lipids Lab Results  Component Value Date   CHOL 274 (H) 01/07/2024   HDL 83 01/07/2024   LDLCALC 167 (H) 01/07/2024   TRIG 110 01/07/2024   CHOLHDL 3.3 01/07/2024   Last hemoglobin A1c No results found for: "HGBA1C" Last thyroid functions Lab Results  Component Value Date   TSH 1.75 07/09/2023   Last vitamin D Lab Results  Component Value Date   VD25OH 29 (L) 07/09/2023   Last vitamin B12 and Folate No results found for: "VITAMINB12", "FOLATE"    The 10-year ASCVD risk score (Arnett DK, et al., 2019) is: 10.6%    Assessment & Plan:   Problem List Items Addressed This Visit   None Visit Diagnoses       Essential hypertension    -  Primary   Relevant Medications   lisinopril-hydrochlorothiazide (ZESTORETIC) 10-12.5 MG tablet   Other Relevant Orders   Comprehensive metabolic panel with GFR (Completed)   Lipid panel (Completed)     Hyperlipidemia, unspecified hyperlipidemia type       Relevant Medications   lisinopril-hydrochlorothiazide (ZESTORETIC) 10-12.5 MG tablet   Other Relevant Orders   Comprehensive metabolic panel with GFR (Completed)   Lipid panel (Completed)   Lipoprotein A (LPA)     Assessment and Plan Assessment & Plan Hypertension   She is on lisinopril with hydrochlorothiazide (HCTZ). Although the cardiologist prescribed lisinopril without HCTZ, she chose not to take it due to side effect concerns. Blood pressure is slightly elevated today. Continue lisinopril with HCTZ and ensure the correct prescription is picked up from the pharmacy.  Hyperlipidemia   With a family history of cardiovascular disease and a calcium score of 48, she is at high risk for heart attack and stroke. The cardiologist prescribed rosuvastatin for primary prevention,  but she has not started it due to side effect concerns. Discussed the importance of primary prevention given her genetic risk factors. Consider dosing options of 5 mg daily or 10 mg three times a week. Order LP(a) blood test to assess genetic risk and discuss potential dosing options for rosuvastatin. Consider starting rosuvastatin based on test results.  Family History of Cardiovascular Disease  Her significant family history includes a sister who passed away from cardiovascular disease and a father who died of unknown causes potentially related to heart issues, increasing her risk for cardiovascular events. Emphasize the importance of managing cardiovascular risk factors, including hypertension and hyperlipidemia.  Follow-up   She has a follow-up appointment with a cardiology PA on the 24th to discuss recent tests and management plans. Attend this appointment.    Return in about 6 months (around 07/08/2024), or if symptoms worsen or fail to improve, for annual exam, fasting.    Davone Shinault R Lowne Chase, DO

## 2024-01-09 ENCOUNTER — Other Ambulatory Visit: Payer: Self-pay | Admitting: Family Medicine

## 2024-01-09 NOTE — Assessment & Plan Note (Signed)
 Well controlled, no changes to meds. Encouraged heart healthy diet such as the DASH diet and exercise as tolerated.

## 2024-01-12 LAB — COMPREHENSIVE METABOLIC PANEL WITH GFR
AG Ratio: 1.4 (calc) (ref 1.0–2.5)
ALT: 15 U/L (ref 6–29)
AST: 19 U/L (ref 10–35)
Albumin: 4.6 g/dL (ref 3.6–5.1)
Alkaline phosphatase (APISO): 64 U/L (ref 37–153)
BUN: 12 mg/dL (ref 7–25)
CO2: 30 mmol/L (ref 20–32)
Calcium: 10.1 mg/dL (ref 8.6–10.4)
Chloride: 98 mmol/L (ref 98–110)
Creat: 0.62 mg/dL (ref 0.50–1.05)
Globulin: 3.3 g/dL (ref 1.9–3.7)
Glucose, Bld: 83 mg/dL (ref 65–99)
Potassium: 4.5 mmol/L (ref 3.5–5.3)
Sodium: 138 mmol/L (ref 135–146)
Total Bilirubin: 0.8 mg/dL (ref 0.2–1.2)
Total Protein: 7.9 g/dL (ref 6.1–8.1)
eGFR: 99 mL/min/{1.73_m2} (ref >=60)

## 2024-01-12 LAB — LIPID PANEL
Cholesterol: 274 mg/dL — ABNORMAL HIGH (ref <200)
HDL: 83 mg/dL (ref >=50)
LDL Cholesterol (Calc): 167 mg/dL — ABNORMAL HIGH
Non-HDL Cholesterol (Calc): 191 mg/dL — ABNORMAL HIGH (ref <130)
Total CHOL/HDL Ratio: 3.3 (calc) (ref <5.0)
Triglycerides: 110 mg/dL (ref <150)

## 2024-01-12 LAB — LIPOPROTEIN A (LPA): Lipoprotein (a): 421 nmol/L — ABNORMAL HIGH (ref <75)

## 2024-01-14 ENCOUNTER — Encounter: Payer: Self-pay | Admitting: Family Medicine

## 2024-01-17 ENCOUNTER — Encounter: Payer: Self-pay | Admitting: Emergency Medicine

## 2024-01-17 ENCOUNTER — Ambulatory Visit: Attending: Emergency Medicine | Admitting: Emergency Medicine

## 2024-01-17 VITALS — BP 130/72 | HR 75 | Ht 65.0 in | Wt 142.0 lb

## 2024-01-17 DIAGNOSIS — I2510 Atherosclerotic heart disease of native coronary artery without angina pectoris: Secondary | ICD-10-CM

## 2024-01-17 DIAGNOSIS — E785 Hyperlipidemia, unspecified: Secondary | ICD-10-CM

## 2024-01-17 DIAGNOSIS — I10 Essential (primary) hypertension: Secondary | ICD-10-CM | POA: Diagnosis present

## 2024-01-17 MED ORDER — ROSUVASTATIN CALCIUM 10 MG PO TABS: 10.0000 mg | ORAL_TABLET | ORAL | 3 refills | Status: DC

## 2024-01-17 NOTE — Patient Instructions (Signed)
 Medication Instructions:  Start Rosuvastatin  10 mg 3 times a week (Monday, Wednesday, Friday) *If you need a refill on your cardiac medications before your next appointment, please call your pharmacy*  Lab Work: In 8 weeks we are going to need a fasting lipid panel and Cmet If you have labs (blood work) drawn today and your tests are completely normal, you will receive your results only by: MyChart Message (if you have MyChart) OR A paper copy in the mail If you have any lab test that is abnormal or we need to change your treatment, we will call you to review the results.  Testing/Procedures: No testing  Follow-Up: At [facility], you and your health needs are our priority.  As part of our continuing mission to provide you with exceptional heart care, our providers are all part of one team.  This team includes your primary Cardiologist (physician) and Advanced Practice Providers or APPs (Physician Assistants and Nurse Practitioners) who all work together to provide you with the care you need, when you need it.  Your next appointment:   5 month(s)  Provider:   Palmer Bobo, NP  We recommend signing up for the patient portal called "MyChart".  Sign up information is provided on this After Visit Summary.  MyChart is used to connect with patients for Virtual Visits (Telemedicine).  Patients are able to view lab/test results, encounter notes, upcoming appointments, etc.  Non-urgent messages can be sent to your provider as well.   To learn more about what you can do with MyChart, go to [email or web address].   Other Instructions:   1st Floor: - Lobby - Registration  - Pharmacy  - Lab - Cafe  2nd Floor: - PV Lab - Diagnostic Testing (echo, CT, nuclear med)  3rd Floor: - Vacant  4th Floor: - TCTS (cardiothoracic surgery) - AFib Clinic - Structural Heart Clinic - Vascular Surgery  - Vascular Ultrasound  5th Floor: - HeartCare Cardiology (general and EP) -  Clinical Pharmacy for coumadin, hypertension, lipid, weight-loss medications, and med management appointments    Valet parking services will be available as well.

## 2024-01-17 NOTE — Progress Notes (Unsigned)
 Cardiology Office Note:    Date:  01/17/2024  ID:  Savannah Deleon, [place] 1961-03-24, MRN [id number] PCP: Savannah Deleon, Savannah Chalk, DO  [facility] Providers Cardiologist:  Savannah Evans Madireddy, MD { Click to update primary MD,subspecialty MD or APP then REFRESH:1}    {Click to Open Review  :1}   Patient Profile:      Chief Complaint: 18-month follow-up for hypertension and hyperlipidemia History of Present Illness:  Savannah Deleon is a 63 y.o. female with visit-pertinent history of hypertension, hyperlipidemia, osteoarthritis, s/p excision of right upper arm melanoma, asthma, environmental allergies  Patient established with cardiology service on 07/26/2023 for the evaluation of abnormal EKG findings and recent ER visit, high blood pressure, hyperlipidemia.  In August 2024 she had a visit to the ER for symptoms associated with epigastric discomfort, nausea, vomiting.  Workup in the ER was unremarkable for any significant cardiac abnormalities.  EKG in the ER noted nonspecific T wave changes and prolonged QT.  Last seen in office on 07/18/2023 during her initial evaluation.  She was on lisinopril -hydrochlorothiazide  10-12.5 mg combination pill she was concerned about potential side effects from current medication and was wondering if this can be de-escalated.  Her combination pill was discontinued and she was prescribed lisinopril  10 mg daily.  There is no significant abnormalities noted on EKG in office other than nonspecific repolarization changes.  Echocardiogram was ordered and completed on 11/24 showing LVEF 60-65%, no RWMA, normal diastolic parameters, RV function size normal, no valvular abnormalities.  CT cardiac scoring showed coronary calcium  score 48.3 (75th percentile).   Discussed the use of AI scribe software for clinical note transcription with the patient, who gave verbal consent to proceed.  History of Present Illness The patient, with a history of hypertension and  high cholesterol, presents for a follow-up visit.  Today she is without any cardiovascular concerns or complaints.  She notes that she is feeling "great" overall.    She would like to discuss her recent cholesterol labs and CT scan in further detail.  She has been prescribed Crestor  for her high cholesterol in the past but has not been taking it due to concerns about side effects.  In fact she is yet to start it.  The patient reports a family history of cardiovascular disease. The patient's LDL cholesterol levels have consistently been in the 160s for the past few years, despite attempts at diet and exercise.  Patient also notes that she continued taking her lisinopril /hydrochlorothiazide  rather than switching to lisinopril  alone.  She decided to do this due to fear of further side effects if she made a further medication change.  The patient has been relatively active and tries to maintain a healthy diet but admits to occasional lapses.  She denies chest pain, shortness of breath, lower extremity edema, fatigue, palpitations, melena, hematuria, hemoptysis, diaphoresis, weakness, presyncope, syncope, orthopnea, and PND.  Review of systems:  Please see the history of present illness. All other systems are reviewed and otherwise negative.     Home Medications:    Current Meds  Medication Sig   lisinopril -hydrochlorothiazide  (ZESTORETIC ) 10-12.5 MG tablet Take 1 tablet by mouth daily.   Multiple Vitamins-Minerals (MULTIVITAMIN PO) Take 1 tablet by mouth daily.   Studies Reviewed:       CT cardiac scoring 08/23/2023 Coronary calcium  score of 48.3. This was 37 percentile for age-, race-,   and sex-matched controls.  Echocardiogram 08/23/2023 1. Left ventricular ejection fraction, by estimation, is 60  to 65%. The  left ventricle has normal function. The left ventricle has no regional  wall motion abnormalities. Left ventricular diastolic parameters were  normal.   2. Right ventricular  systolic function is normal. The right ventricular  size is normal.   3. The mitral valve is grossly normal. No evidence of mitral valve  regurgitation. No evidence of mitral stenosis.   4. The aortic valve is tricuspid. Aortic valve regurgitation is not  visualized. No aortic stenosis is present.   5. The inferior vena cava is normal in size with greater than 50%  respiratory variability, suggesting right atrial pressure of 3 mmHg.   Risk Assessment/Calculations:             Physical Exam:   VS:  BP 130/72   Pulse 75   Ht 5\' 5"  (1.651 m)   Wt 142 lb (64.4 kg)   LMP 06/28/2013   SpO2 100%   BMI 23.63 kg/m    Wt Readings from Last 3 Encounters:  01/17/24 142 lb (64.4 kg)  01/07/24 142 lb 3.2 oz (64.5 kg)  07/26/23 146 lb (66.2 kg)    GEN: Well nourished, well developed in no acute distress NECK: No JVD; No carotid bruits CARDIAC: RRR, no murmurs, rubs, gallops RESPIRATORY:  Clear to auscultation without rales, wheezing or rhonchi  ABDOMEN: Soft, non-tender, non-distended EXTREMITIES:  No edema; No acute deformity     Assessment and Plan:  Coronary artery disease CT cardiac scoring 07/2023 with coronary calcium  score of 48.3 (75th percentile) - Today patient is without any anginal symptoms, no indication for further ischemic evaluation at this time - Start rosuvastatin  10 mg 3 times weekly (M, W, F) as we are starting low and slow to avoid potential side effects  Hypertension Blood pressure today is 130/72 and under adequate control - Continue lisinopril -hydrochlorothiazide  10-12.5 mg daily  Hyperlipidemia LDL 167, TG 110, HDL 83, TC 274 & Lipoprotein A 421 on 12/2023 Her LDL is is significantly elevated continue diet and exercise alone.  Her lipoprotein A is also elevated suggesting a genetic predisposition.  Given her recent CT calcium  scoring of 48.3, her LDL goal should realistically be less than 70 Her ASCVD score is 11.4% and statin therapy is recommended - Patient  was prescribed statin therapy on 08/2023, however did not start medication due to fear of side effects, which is understandable - Plan will be to start rosuvastatin  10 mg 3 times weekly (M, W, F) and repeat fasting lipid panel and LFTs in 8 weeks.  If she is without any side effects we will plan to transition to daily and increase as needed to get the goal - Given her elevated LDL as well as elevated lipoprotein A, if she does begin to experience myalgias on statin therapy she would be a good candidate to refer to Pharm.[facility]. lipid clinic for consideration of PCSK9 inhibitor therapy      Dispo:  Return in about 5 months (around 06/18/2024) for Palmer Bobo NP.  Signed, Ava Boatman, NP

## 2024-01-31 ENCOUNTER — Other Ambulatory Visit: Payer: Self-pay | Admitting: Family Medicine

## 2024-03-17 ENCOUNTER — Other Ambulatory Visit: Payer: Self-pay | Admitting: Family Medicine

## 2024-03-17 DIAGNOSIS — Z1231 Encounter for screening mammogram for malignant neoplasm of breast: Secondary | ICD-10-CM

## 2024-04-19 ENCOUNTER — Other Ambulatory Visit: Payer: Self-pay | Admitting: Family Medicine

## 2024-04-19 DIAGNOSIS — I10 Essential (primary) hypertension: Secondary | ICD-10-CM

## 2024-04-19 NOTE — Telephone Encounter (Signed)
 Refill too soon just filled on 04/10/24

## 2024-06-14 LAB — HM MAMMOGRAPHY

## 2024-06-19 ENCOUNTER — Ambulatory Visit: Admitting: Emergency Medicine

## 2024-07-05 ENCOUNTER — Ambulatory Visit (INDEPENDENT_AMBULATORY_CARE_PROVIDER_SITE_OTHER): Payer: Self-pay | Admitting: Podiatry

## 2024-07-05 ENCOUNTER — Encounter: Payer: Self-pay | Admitting: Podiatry

## 2024-07-05 DIAGNOSIS — M21619 Bunion of unspecified foot: Secondary | ICD-10-CM

## 2024-07-05 DIAGNOSIS — M2042 Other hammer toe(s) (acquired), left foot: Secondary | ICD-10-CM | POA: Diagnosis not present

## 2024-07-05 DIAGNOSIS — M2041 Other hammer toe(s) (acquired), right foot: Secondary | ICD-10-CM | POA: Diagnosis not present

## 2024-07-05 NOTE — Progress Notes (Signed)
 Subjective:   Patient ID: Savannah Deleon, female   DOB: 63 y.o.   MRN: [id number]   HPI Patient presents with history of structural deformity of foot right over left wants it checked and consideration long-term of surgery if needed.  Patient does not smoke likes to be active   Review of Systems  All other systems reviewed and are negative.       Objective:  Physical Exam Vitals and nursing note reviewed.  Constitutional:      Appearance: She is well-developed.  Pulmonary:     Effort: Pulmonary effort is normal.  Musculoskeletal:        General: Normal range of motion.  Skin:    General: Skin is warm.  Neurological:     Mental Status: She is alert.     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  Patient may require surgery at 1 point I did educate her on digital fusion arthroplasty digit 5 or possible bunionectomy

## 2024-07-11 ENCOUNTER — Encounter: Admitting: Family Medicine

## 2024-07-13 ENCOUNTER — Encounter: Payer: Self-pay | Admitting: Family Medicine

## 2024-07-27 ENCOUNTER — Encounter: Payer: Self-pay | Admitting: Family Medicine

## 2024-07-27 ENCOUNTER — Ambulatory Visit (INDEPENDENT_AMBULATORY_CARE_PROVIDER_SITE_OTHER): Payer: Self-pay | Admitting: Family Medicine

## 2024-07-27 VITALS — BP 110/60 | HR 90 | Temp 98.2°F | Ht 65.0 in | Wt 151.0 lb

## 2024-07-27 DIAGNOSIS — Z7689 Persons encountering health services in other specified circumstances: Secondary | ICD-10-CM

## 2024-07-27 DIAGNOSIS — Z2821 Immunization not carried out because of patient refusal: Secondary | ICD-10-CM

## 2024-07-27 DIAGNOSIS — I7090 Unspecified atherosclerosis: Secondary | ICD-10-CM | POA: Diagnosis not present

## 2024-07-27 DIAGNOSIS — E559 Vitamin D deficiency, unspecified: Secondary | ICD-10-CM | POA: Diagnosis not present

## 2024-07-27 DIAGNOSIS — Z85820 Personal history of malignant melanoma of skin: Secondary | ICD-10-CM

## 2024-07-27 DIAGNOSIS — I10 Essential (primary) hypertension: Secondary | ICD-10-CM | POA: Diagnosis not present

## 2024-07-27 NOTE — Progress Notes (Signed)
 I,Savannah Deleon, CMA,acting as a neurosurgeon for [employer], NP.,have documented all relevant documentation on the behalf of Savannah Creighton, NP,as directed by  Savannah Creighton, NP while in the presence of Savannah Creighton, NP.  Subjective:  Patient ID: Savannah Deleon , female    DOB: September 15, 1961 , 63 y.o.   MRN: [id number]  Chief Complaint  Patient presents with   Establish Care    Patient presents today to establish care. Patient reports she last seen a pcp 6 months ago.    Hypertension    Patient presents today for a bpc. Patient reports compliance with her meds. Patient denies having any chest pain, sob or headaches. Patient would like to discuss switching her blood pressure medications due to possible side effects. Patient reports she does have a cardiology and her name is Dr.Fountain.     HPI Discussed the use of AI scribe software for clinical note transcription with the patient, who gave verbal consent to proceed.  History of Present Illness      Savannah Deleon is a 63 year old female with hypertension and arteriosclerosis who presents to establish care.  She has a 22-year history of hypertension, currently managed with lisinopril  and hydrochlorothiazide  for the past 10 years. She experiences hypersensitivity to light and hair thinning, which she believes are side effects of her medication. Despite these side effects, she has not altered her regimen. Approximately a year ago, she experienced lightheadedness, nausea, and vomiting, leading to an emergency room visit. A CT scan was performed, and a change in her blood pressure medication was suggested, but she did not proceed with the change.  Her cholesterol levels have been elevated since at least 2017, with consistently high LDL levels. She has not started statin therapy, specifically Crestor , despite being advised to do so. She is interested in managing her cholesterol through diet and exercise.  She has a history of melanoma on her arm,  which was excised without further treatment. She has undergone genetic counseling due to family history concerns but has not completed blood work for genetic testing. She has two sisters who have undergone genetic testing.  She has arthritis in her feet, diagnosed by a rheumatologist, and was advised to seek treatment only if in pain. She currently reports no pain but is concerned about potential foot issues due to her arthritis.  She has a history of vitamin D  deficiency and has been prescribed high-dose vitamin D  in the past. She is interested in checking her vitamin D  levels again as she believes she may still be deficient.  She has regular bowel movements and no issues with daily activities. She does not smoke and drinks alcohol in moderation.      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 11   Pancreatic cancer Other        paternal grandmother's paternal aunt   Colon polyps Neg Hx    Esophageal cancer Neg Hx    Rectal cancer Neg Hx    Stomach cancer Neg Hx      Current Outpatient Medications:     lisinopril -hydrochlorothiazide  (ZESTORETIC ) 10-12.5 MG tablet, Take 1 tablet by mouth daily., Disp: 90 tablet, Rfl: 1   Multiple Vitamins-Minerals (MULTIVITAMIN PO), Take 1 tablet by mouth daily., Disp: , Rfl:    rosuvastatin  (CRESTOR ) 10 MG tablet, Take 1 tablet (10 mg total) by mouth every Monday, Wednesday, and Friday., Disp: 36 tablet, Rfl: 3   Allergies  Allergen Reactions   Aloe Anaphylaxis    Cannot use topically- redness and swelling, diff breathing   Elemental Sulfur Anaphylaxis    SOB, Swelling, rash   Quinoa-Kale-Hemp [Alimentum]     Rash, swelling   Wheat      Review of Systems  Constitutional: Negative.   Eyes: Negative.   Musculoskeletal: Negative.   Skin: Negative.   Psychiatric/Behavioral: Negative.       Today's Vitals   07/27/24 0923  BP: 110/60  Pulse: 90  Temp: 98.2 F (36.8 C)  TempSrc: Oral  Weight: 151 lb (68.5 kg)  Height: 5' 5 (1.651 m)  PainSc: 0-No pain   Body mass index is 25.13 kg/m.  Wt Readings from Last 3 Encounters:  07/27/24 151 lb (68.5 kg)  01/17/24 142 lb (64.4 kg)  01/07/24 142 lb 3.2 oz (64.5 kg)    The 10-year ASCVD risk score (Arnett DK, et al., 2019) is: 7.8%   Values used to calculate the score:     Age: 50 years     Clincally relevant sex: Female     Is Non-Hispanic African American: Yes     Diabetic: No     Tobacco smoker: No     Systolic Blood Pressure: 110 mmHg     Is BP treated: Yes     HDL Cholesterol: 83 mg/dL     Total Cholesterol: 274 mg/dL  Objective:  Physical Exam Constitutional:      Appearance: Normal appearance.  HENT:     Head: Normocephalic.  Cardiovascular:     Rate and Rhythm: Normal rate and regular rhythm.     Pulses: Normal pulses.     Heart sounds: Normal heart sounds.  Pulmonary:     Effort: Pulmonary effort is normal.     Breath sounds: Normal breath sounds.  Abdominal:     General: Bowel sounds are normal.  Skin:    General: Skin is warm and dry.  Neurological:     Mental  Status: She is alert. Mental status is at baseline.         Assessment And Plan:  Encounter to establish care with new doctor  Primary hypertension -     Amb Ref to Medical Weight Management -     CBC; Future -     CMP14+EGFR; Future -     EKG 12-Lead; Future -     Microalbumin / creatinine urine ratio; Future -  POCT URINALYSIS DIP (CLINITEK)  Vitamin D  deficiency -     VITAMIN D  25 Hydroxy (Vit-D Deficiency, Fractures); Future  Influenza vaccination declined  History of malignant melanoma  Atherosclerosis -     Amb Ref to Medical Weight Management -     Lipid panel; Future    Return in about 1 month (around 08/27/2024) for Welcome to Medical Wellness.  Patient was given opportunity to ask questions. Patient verbalized understanding of the plan and was able to repeat key elements of the plan. All questions were answered to their satisfaction.    I, Savannah Creighton, NP, have reviewed all documentation for this visit. The documentation on 08/04/2024 for the exam, diagnosis, procedures, and orders are all accurate and complete.    IF YOU HAVE BEEN REFERRED TO A SPECIALIST, IT MAY TAKE 1-2 WEEKS TO SCHEDULE/PROCESS THE REFERRAL. IF YOU HAVE NOT HEARD FROM US /SPECIALIST IN TWO WEEKS, PLEASE GIVE US  A CALL AT [phone].

## 2024-08-04 DIAGNOSIS — Z85820 Personal history of malignant melanoma of skin: Secondary | ICD-10-CM | POA: Insufficient documentation

## 2024-08-04 DIAGNOSIS — I7090 Unspecified atherosclerosis: Secondary | ICD-10-CM | POA: Insufficient documentation

## 2024-08-04 DIAGNOSIS — E559 Vitamin D deficiency, unspecified: Secondary | ICD-10-CM | POA: Insufficient documentation

## 2024-08-04 DIAGNOSIS — Z7689 Persons encountering health services in other specified circumstances: Secondary | ICD-10-CM | POA: Insufficient documentation

## 2024-08-04 DIAGNOSIS — Z2821 Immunization not carried out because of patient refusal: Secondary | ICD-10-CM | POA: Insufficient documentation

## 2024-09-13 ENCOUNTER — Encounter: Payer: Self-pay | Admitting: Family Medicine

## 2024-09-13 ENCOUNTER — Ambulatory Visit (INDEPENDENT_AMBULATORY_CARE_PROVIDER_SITE_OTHER): Payer: Self-pay | Admitting: Family Medicine

## 2024-09-13 VITALS — BP 120/72 | HR 81 | Temp 98.6°F | Ht 65.0 in | Wt 155.0 lb

## 2024-09-13 DIAGNOSIS — E559 Vitamin D deficiency, unspecified: Secondary | ICD-10-CM | POA: Diagnosis not present

## 2024-09-13 DIAGNOSIS — I10 Essential (primary) hypertension: Secondary | ICD-10-CM

## 2024-09-13 DIAGNOSIS — Z0000 Encounter for general adult medical examination without abnormal findings: Secondary | ICD-10-CM | POA: Diagnosis not present

## 2024-09-13 DIAGNOSIS — I7090 Unspecified atherosclerosis: Secondary | ICD-10-CM | POA: Diagnosis not present

## 2024-09-13 LAB — POCT URINALYSIS DIP (CLINITEK)
Bilirubin, UA: NEGATIVE
Glucose, UA: NEGATIVE mg/dL
Ketones, POC UA: NEGATIVE mg/dL
Leukocytes, UA: NEGATIVE
Nitrite, UA: NEGATIVE
POC PROTEIN,UA: NEGATIVE
Spec Grav, UA: 1.02 (ref 1.010–1.025)
Urobilinogen, UA: 0.2 U/dL
pH, UA: 6.5 (ref 5.0–8.0)

## 2024-09-13 MED ORDER — ROSUVASTATIN CALCIUM 10 MG PO TABS: 10.0000 mg | ORAL_TABLET | ORAL | 3 refills | Status: AC

## 2024-09-13 MED ORDER — LISINOPRIL-HYDROCHLOROTHIAZIDE 10-12.5 MG PO TABS: 1.0000 | ORAL_TABLET | Freq: Every day | ORAL | 2 refills | Status: AC

## 2024-09-13 NOTE — Progress Notes (Signed)
 "  Chief Complaint  Patient presents with   welcome to medicare     Subjective:   Savannah Deleon is a 63 y.o. female who presents for a Welcome to Medicare Exam.    Savannah Deleon is a 63 year old female who presents for a Medicare wellness visit.  She started rosuvastatin  on November 3rd for cholesterol management, taking it on Monday, Wednesday, and Friday. She also takes a multivitamin intermittently. She has a history of low vitamin D  levels and has been on vitamin D  supplements in the past. She notes heat sensitivity due to lisinopril , which affects her sun exposure.  She had a mole on her right hand that was biopsied and diagnosed as melanoma. She underwent surgery for it and now has semi-annual dermatology visits.  No personal or family history of diabetes. She sought a nutritionist referral for preventive care but was informed that insurance would not cover it unless she was diabetic or obese.  Two years ago, she experienced an episode of feeling unwell characterized by lightheadedness, nausea, vomiting, and diarrhea, which led to a cardiology follow-up. She was prescribed rosuvastatin  by the cardiologist.  She takes lisinopril  daily for blood pressure management and has a supply of it at home. There was a change in prescription to include a diuretic.  No issues with anxiety, depression, or recent falls. She drinks alcohol socially and has no recent history of swelling or irregular bowel movements.  She has not completed the second doses of her pneumonia and shingles vaccines.          Discussed the use of AI scribe software for clinical note transcription with the patient, who gave verbal consent to proceed.  History of Present Illness   Savannah Deleon is a 63 year old female who presents for a Medicare wellness visit.  She started rosuvastatin  on November 3rd for cholesterol management, taking it on Monday, Wednesday, and Friday. She also takes a  multivitamin intermittently. She has a history of low vitamin D  levels and has been on vitamin D  supplements in the past. She notes heat sensitivity due to lisinopril , which affects her sun exposure.  She had a mole on her right hand that was biopsied and diagnosed as melanoma. She underwent surgery for it and now has semi-annual dermatology visits.  No personal or family history of diabetes. She sought a nutritionist referral for preventive care but was informed that insurance would not cover it unless she was diabetic or obese.  Two years ago, she experienced an episode of feeling unwell characterized by lightheadedness, nausea, vomiting, and diarrhea, which led to a cardiology follow-up. She was prescribed rosuvastatin  by the cardiologist.  She takes lisinopril  daily for blood pressure management and has a supply of it at home. There was a change in prescription to include a diuretic.  No issues with anxiety, depression, or recent falls. She drinks alcohol socially and has no recent history of swelling or irregular bowel movements.  She has not completed the second doses of her pneumonia and shingles vaccines.      Fall Screening Falls in the past year?: 0 Number of falls in past year: 0 Was there an injury with Fall?: 0 Fall Risk Category Calculator: 0 Patient Fall Risk Level: Low Fall Risk  Fall Risk Patient at Risk for Falls Due to: No Fall Risks Fall risk Follow up: Falls evaluation completed  Cognitive Assessment Give patient an address phrase to remember (5 components): Mary had  a little lamb that followed her everywhere. About what time is it?: 0 points Count backwards from 20 to 1: 0 points Say the months of the year in reverse: 0 points Repeat the address phrase from earlier: 0 points    Allergies (verified) Aloe, Elemental sulfur, Quinoa-kale-hemp [alimentum], and Wheat   Current Medications (verified) Outpatient Encounter Medications as of 09/13/2024   Medication Sig   lisinopril -hydrochlorothiazide  (ZESTORETIC ) 10-12.5 MG tablet Take 1 tablet by mouth daily.   Multiple Vitamins-Minerals (MULTIVITAMIN PO) Take 1 tablet by mouth daily.   rosuvastatin  (CRESTOR ) 10 MG tablet Take 1 tablet (10 mg total) by mouth every Monday, Wednesday, and Friday.   No facility-administered encounter medications on file as of 09/13/2024.    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   Melanoma of skin (HCC) 05/26/2023   [place]     Nonspecific abnormal electrocardiogram (ECG) (EKG) 07/26/2023   Osteoarthritis 12/21/2022   Preventative health care 06/07/2017   Vitamin D  deficiency    Past Surgical History:  Procedure Laterality Date   ARM SKIN LESION BIOPSY / EXCISION Right 06/14/2023   Dr Jeanice Potters   BREAST BIOPSY Left 04/10/2020   CATARACT EXTRACTION Left    CYSTECTOMY  09/29/2011   Removed form the Back   RETINAL DETACHMENT SURGERY Left 07/2020   dr selinda sanders   RETINAL DETACHMENT SURGERY Left 12/2020   sanders   Family History  Problem Relation Age of Onset   Ovarian cancer Mother 29   Hypertension Mother    Colon cancer Father 53   Heart disease Sister 46   Heart failure Maternal Grandfather 82   Colon cancer Paternal Grandmother        dx 29s, d. 47   Pancreatic cancer Other        paternal grandmother's paternal aunt   Colon polyps Neg Hx    Esophageal cancer Neg Hx    Rectal cancer Neg Hx    Stomach cancer Neg Hx    Social History   Occupational History   Occupation: retired   Tobacco Use   Smoking status: Never    Passive exposure: Past   Smokeless tobacco: Never  Vaping Use   Vaping status: Never Used  Substance and Sexual Activity   Alcohol use: Yes    Comment: Seldom   Drug use: No   Sexual activity: Yes    Partners: Male   Tobacco Counseling Counseling given: Not Answered  SDOH Screenings   Food Insecurity: No Food Insecurity (09/13/2024)  Housing: Low Risk (09/13/2024)  Transportation Needs: No Transportation Needs (09/13/2024)  Utilities: Not At Risk (09/13/2024)  Alcohol Screen: Low Risk (09/13/2024)  Depression (PHQ2-9): Low Risk (09/13/2024)  Financial Resource Strain: Low Risk (09/13/2024)  Physical Activity: Insufficiently Active (09/13/2024)  Social Connections: Moderately Integrated (09/13/2024)  Stress: No Stress Concern Present (09/13/2024)  Tobacco Use: Low Risk (09/13/2024)  Health Literacy: Adequate Health Literacy (09/13/2024)   See flowsheets for full screening details  Depression Screen PHQ 2 & 9 Depression Scale- Over the past 2 weeks, how often have you been bothered by any of the following problems? Little interest or pleasure in doing things: 0 Feeling down, depressed, or hopeless (PHQ Adolescent also includes...irritable): 0 PHQ-2 Total Score: 0 Trouble falling or staying asleep, or sleeping too much: 0 Feeling tired or having little energy: 0 Poor appetite or overeating (PHQ Adolescent also includes...weight loss): 0 Feeling bad about yourself - or that you are a failure or have let yourself or your family down: 0 Trouble concentrating on things, such as reading the newspaper or watching television (PHQ Adolescent also includes...like school work): 0 Moving or speaking so slowly that other people could have noticed. Or the opposite - being so fidgety or restless that you have been moving around a lot more than usual: 0 Thoughts that you would be better off dead, or of hurting yourself in some way: 0 PHQ-9 Total  Score: 0 If you checked off any problems, how difficult have these problems made it for you to do your work, take care of things at home, or get along with other people?: Not difficult at all      Goals Addressed   None          Objective:    Today's Vitals   09/13/24 0938  BP: 120/72  Pulse: 81  Temp: 98.6 F (37 C)  TempSrc: Oral  Weight: 155 lb (70.3 kg)  Height: 5' 5 (1.651 m)  PainSc: 0-No pain   Body mass index is 25.79 kg/m.   Physical Exam Constitutional:      Appearance: Normal appearance.  HENT:     Head: Normocephalic.  Cardiovascular:     Rate and Rhythm: Normal rate and regular rhythm.     Pulses: Normal pulses.     Heart sounds: Normal heart sounds.  Pulmonary:     Effort: Pulmonary effort is normal.     Breath sounds: Normal breath sounds.  Abdominal:     General: Bowel sounds are normal.  Musculoskeletal:        General: Normal range of motion.  Skin:    General: Skin is warm and dry.  Neurological:     General: No focal deficit present.     Mental Status: She is alert and oriented to person, place, and time. Mental status is at baseline.  Psychiatric:        Mood and Affect: Mood normal.     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      Immunizations and Health Maintenance Health Maintenance  Topic Date Due   Pneumococcal Vaccine: 50+ Years (1 of 2 - PCV) Never done   Zoster Vaccines- Shingrix  (2 of 2) 10/07/2018   Bone Density Scan  Never done   Influenza Vaccine  12/26/2024 (Originally 04/28/2024)   HIV Screening  01/07/2027 (Originally 01/03/1976)   COVID-19 Vaccine (2 - Pfizer risk series) 01/23/2027 (Originally 07/22/2020)   Mammogram  06/14/2025   Medicare Annual Wellness (AWV)  09/13/2025   Cervical Cancer Screening (HPV/Pap Cotest)  06/08/2026    Colonoscopy  05/04/2028   DTaP/Tdap/Td (4 - Td or Tdap) 06/22/2032   Hepatitis C Screening  Completed   Hepatitis B Vaccines 19-59 Average Risk  Aged Out  Meningococcal B Vaccine  Aged Out    EKG: normal EKG, normal sinus rhythm, unchanged from previous tracings, normal sinus rhythm     Assessment/Plan:  This is a routine wellness examination for Savannah Deleon.  Patient Care Team: Petrina Pries, NP as PCP - General (Family Medicine) Madireddy, Alean SAUNDERS, MD as PCP - Cardiology (Cardiology) [place], Tawni CROME, MD as Referring Physician (Dermatology) Pyrtle, Gordy HERO, MD as Consulting Physician (Gastroenterology) Armond Cape, MD as Consulting Physician (Obstetrics and Gynecology) Humberto Charmaine HERO, DMD (Dentistry) [facility], [place]  I have personally reviewed and noted the following in the patients chart:   Medical and social history Use of alcohol, tobacco or illicit drugs  Current medications and supplements including opioid prescriptions. Functional ability and status Nutritional status Physical activity Advanced directives List of other physicians Hospitalizations, surgeries, and ER visits in previous 12 months Vitals Screenings to include cognitive, depression, and falls Referrals and appointments  Orders Placed This Encounter  Procedures   Microalbumin / creatinine urine ratio   POCT URINALYSIS DIP (CLINITEK)   EKG 12-Lead   In addition, I have reviewed and discussed with patient certain preventive protocols, quality metrics, and best practice recommendations. A written personalized care plan for preventive services as well as general preventive health recommendations were provided to patient.   I, Pries Petrina, NP, have reviewed all documentation for this visit. The documentation on 09/20/2024 for the exam, diagnosis, procedures, and orders are all accurate and complete.    "

## 2024-09-14 LAB — CBC
Hematocrit: 45.6 % (ref 34.0–46.6)
Hemoglobin: 14.4 g/dL (ref 11.1–15.9)
MCH: 27.3 pg (ref 26.6–33.0)
MCHC: 31.6 g/dL (ref 31.5–35.7)
MCV: 87 fL (ref 79–97)
Platelets: 323 x10E3/uL (ref 150–450)
RBC: 5.27 x10E6/uL (ref 3.77–5.28)
RDW: 13.7 % (ref 11.7–15.4)
WBC: 6 x10E3/uL (ref 3.4–10.8)

## 2024-09-14 LAB — CMP14+EGFR
ALT: 24 IU/L (ref 0–32)
AST: 27 IU/L (ref 0–40)
Albumin: 4.6 g/dL (ref 3.9–4.9)
Alkaline Phosphatase: 72 IU/L (ref 49–135)
BUN/Creatinine Ratio: 23 (ref 12–28)
BUN: 17 mg/dL (ref 8–27)
Bilirubin Total: 0.5 mg/dL (ref 0.0–1.2)
CO2: 27 mmol/L (ref 20–29)
Calcium: 10.3 mg/dL (ref 8.7–10.3)
Chloride: 98 mmol/L (ref 96–106)
Creatinine, Ser: 0.75 mg/dL (ref 0.57–1.00)
Globulin, Total: 3.1 g/dL (ref 1.5–4.5)
Glucose: 81 mg/dL (ref 70–99)
Potassium: 4.3 mmol/L (ref 3.5–5.2)
Sodium: 140 mmol/L (ref 134–144)
Total Protein: 7.7 g/dL (ref 6.0–8.5)
eGFR: 88 mL/min/1.73 (ref >59)

## 2024-09-14 LAB — LIPID PANEL
Chol/HDL Ratio: 2.8 ratio (ref 0.0–4.4)
Cholesterol, Total: 224 mg/dL — ABNORMAL HIGH (ref 100–199)
HDL: 81 mg/dL (ref >39)
LDL Chol Calc (NIH): 122 mg/dL — ABNORMAL HIGH (ref 0–99)
Triglycerides: 123 mg/dL (ref 0–149)
VLDL Cholesterol Cal: 21 mg/dL (ref 5–40)

## 2024-09-14 LAB — VITAMIN D 25 HYDROXY (VIT D DEFICIENCY, FRACTURES): Vit D, 25-Hydroxy: 27.8 ng/mL — ABNORMAL LOW (ref 30.0–100.0)

## 2024-09-14 LAB — MICROALBUMIN / CREATININE URINE RATIO
Creatinine, Urine: 8.6 mg/dL
Microalbumin, Urine: <3 ug/mL

## 2024-09-20 ENCOUNTER — Ambulatory Visit: Payer: Self-pay | Admitting: Family Medicine

## 2024-09-20 NOTE — Progress Notes (Signed)
 Slightly low vitamin D  levels. Eat foods rich in vitamin D  , your microalbumin results was abnormal, indicating your urine was diluted. We will recheck next in 6 months. Your cholesterol levels are elevated, start taking your cholesterol medication as prescribed.   Thanks!

## 2024-10-23 ENCOUNTER — Ambulatory Visit: Admitting: Obstetrics and Gynecology

## 2024-12-08 ENCOUNTER — Ambulatory Visit: Admitting: Obstetrics

## 2025-03-14 ENCOUNTER — Ambulatory Visit: Payer: Self-pay | Admitting: Family Medicine

## 2025-09-19 ENCOUNTER — Encounter: Payer: Self-pay | Admitting: Family Medicine

## 2025-09-26 ENCOUNTER — Ambulatory Visit: Payer: Self-pay
